# Patient Record
Sex: Female | Born: 1935 | Race: White | Hispanic: No | Smoking: Never smoker
Health system: Southern US, Community
[De-identification: ages and names within clinical notes are randomized; demographics above are authoritative.]

## PROBLEM LIST (undated history)

## (undated) DIAGNOSIS — E7800 Pure hypercholesterolemia, unspecified: Secondary | ICD-10-CM

## (undated) DIAGNOSIS — E079 Disorder of thyroid, unspecified: Secondary | ICD-10-CM

## (undated) DIAGNOSIS — I701 Atherosclerosis of renal artery: Secondary | ICD-10-CM

## (undated) DIAGNOSIS — R0989 Other specified symptoms and signs involving the circulatory and respiratory systems: Secondary | ICD-10-CM

## (undated) DIAGNOSIS — I10 Essential (primary) hypertension: Secondary | ICD-10-CM

## (undated) DIAGNOSIS — I2510 Atherosclerotic heart disease of native coronary artery without angina pectoris: Secondary | ICD-10-CM

## (undated) DIAGNOSIS — M1990 Unspecified osteoarthritis, unspecified site: Secondary | ICD-10-CM

## (undated) HISTORY — DX: Unspecified osteoarthritis, unspecified site: M19.90

## (undated) HISTORY — DX: Atherosclerosis of renal artery: I70.1

## (undated) HISTORY — DX: Essential (primary) hypertension: I10

## (undated) HISTORY — DX: Pure hypercholesterolemia, unspecified: E78.00

## (undated) HISTORY — PX: CATARACT EXTRACTION: SUR2

## (undated) HISTORY — PX: OTHER SURGICAL HISTORY: SHX169

## (undated) HISTORY — DX: Atherosclerotic heart disease of native coronary artery without angina pectoris: I25.10

## (undated) HISTORY — DX: Other specified symptoms and signs involving the circulatory and respiratory systems: R09.89

## (undated) HISTORY — DX: Disorder of thyroid, unspecified: E07.9

---

## 1976-05-25 HISTORY — PX: VARICOSE VEIN SURGERY: SHX832

## 1996-05-25 DIAGNOSIS — R0989 Other specified symptoms and signs involving the circulatory and respiratory systems: Secondary | ICD-10-CM

## 1996-05-25 HISTORY — DX: Other specified symptoms and signs involving the circulatory and respiratory systems: R09.89

## 2002-05-24 ENCOUNTER — Encounter: Payer: Self-pay | Admitting: Orthopedic Surgery

## 2002-05-25 HISTORY — PX: PARTIAL HIP ARTHROPLASTY: SHX733

## 2002-05-29 ENCOUNTER — Encounter: Payer: Self-pay | Admitting: Orthopedic Surgery

## 2002-05-29 ENCOUNTER — Inpatient Hospital Stay (HOSPITAL_COMMUNITY): Admission: RE | Admit: 2002-05-29 | Discharge: 2002-06-04 | Payer: Self-pay | Admitting: Orthopedic Surgery

## 2004-06-04 ENCOUNTER — Ambulatory Visit: Payer: Self-pay | Admitting: Internal Medicine

## 2005-03-30 ENCOUNTER — Ambulatory Visit: Payer: Self-pay | Admitting: Internal Medicine

## 2005-08-18 ENCOUNTER — Ambulatory Visit: Payer: Self-pay | Admitting: Internal Medicine

## 2005-08-31 ENCOUNTER — Ambulatory Visit: Payer: Self-pay | Admitting: Internal Medicine

## 2006-02-02 ENCOUNTER — Ambulatory Visit: Payer: Self-pay | Admitting: Cardiology

## 2006-02-02 ENCOUNTER — Ambulatory Visit: Payer: Self-pay | Admitting: Internal Medicine

## 2006-02-02 ENCOUNTER — Inpatient Hospital Stay (HOSPITAL_COMMUNITY): Admission: EM | Admit: 2006-02-02 | Discharge: 2006-02-03 | Payer: Self-pay | Admitting: Emergency Medicine

## 2006-02-05 ENCOUNTER — Ambulatory Visit: Payer: Self-pay | Admitting: Internal Medicine

## 2006-02-10 ENCOUNTER — Ambulatory Visit: Payer: Self-pay

## 2006-02-18 ENCOUNTER — Ambulatory Visit: Payer: Self-pay | Admitting: Internal Medicine

## 2006-04-07 ENCOUNTER — Ambulatory Visit: Payer: Self-pay | Admitting: Internal Medicine

## 2006-04-07 LAB — CONVERTED CEMR LAB
ALT: 14 units/L (ref 0–40)
AST: 26 units/L (ref 0–37)

## 2006-05-25 DIAGNOSIS — I2510 Atherosclerotic heart disease of native coronary artery without angina pectoris: Secondary | ICD-10-CM

## 2006-05-25 HISTORY — PX: OTHER SURGICAL HISTORY: SHX169

## 2006-05-25 HISTORY — DX: Atherosclerotic heart disease of native coronary artery without angina pectoris: I25.10

## 2006-07-05 ENCOUNTER — Ambulatory Visit: Payer: Self-pay | Admitting: Internal Medicine

## 2006-07-06 ENCOUNTER — Ambulatory Visit: Payer: Self-pay | Admitting: Internal Medicine

## 2006-07-06 LAB — CONVERTED CEMR LAB
Creatinine, Ser: 1.2 mg/dL (ref 0.4–1.2)
Potassium: 4.2 meq/L (ref 3.5–5.1)

## 2006-07-13 ENCOUNTER — Ambulatory Visit: Payer: Self-pay | Admitting: Internal Medicine

## 2006-08-29 ENCOUNTER — Observation Stay (HOSPITAL_COMMUNITY): Admission: EM | Admit: 2006-08-29 | Discharge: 2006-08-31 | Payer: Self-pay | Admitting: Emergency Medicine

## 2006-08-29 ENCOUNTER — Ambulatory Visit: Payer: Self-pay | Admitting: Cardiology

## 2006-08-30 ENCOUNTER — Ambulatory Visit: Payer: Self-pay | Admitting: Internal Medicine

## 2006-08-31 ENCOUNTER — Ambulatory Visit: Payer: Self-pay | Admitting: Vascular Surgery

## 2006-08-31 ENCOUNTER — Encounter: Payer: Self-pay | Admitting: Cardiology

## 2006-09-06 ENCOUNTER — Ambulatory Visit: Payer: Self-pay | Admitting: Internal Medicine

## 2006-09-06 LAB — CONVERTED CEMR LAB
Chloride: 109 meq/L (ref 96–112)
GFR calc Af Amer: 50 mL/min
GFR calc non Af Amer: 41 mL/min
Glucose, Bld: 81 mg/dL (ref 70–99)
Sodium: 145 meq/L (ref 135–145)

## 2006-09-15 ENCOUNTER — Ambulatory Visit: Payer: Self-pay | Admitting: Cardiovascular Disease

## 2006-11-08 ENCOUNTER — Telehealth (INDEPENDENT_AMBULATORY_CARE_PROVIDER_SITE_OTHER): Payer: Self-pay | Admitting: *Deleted

## 2006-11-15 ENCOUNTER — Telehealth (INDEPENDENT_AMBULATORY_CARE_PROVIDER_SITE_OTHER): Payer: Self-pay | Admitting: *Deleted

## 2006-11-15 ENCOUNTER — Ambulatory Visit: Payer: Self-pay | Admitting: Internal Medicine

## 2006-11-15 DIAGNOSIS — R609 Edema, unspecified: Secondary | ICD-10-CM | POA: Insufficient documentation

## 2006-11-15 DIAGNOSIS — I10 Essential (primary) hypertension: Secondary | ICD-10-CM

## 2006-11-15 DIAGNOSIS — M25579 Pain in unspecified ankle and joints of unspecified foot: Secondary | ICD-10-CM | POA: Insufficient documentation

## 2006-11-15 DIAGNOSIS — I8390 Asymptomatic varicose veins of unspecified lower extremity: Secondary | ICD-10-CM | POA: Insufficient documentation

## 2006-11-16 ENCOUNTER — Encounter: Payer: Self-pay | Admitting: Internal Medicine

## 2006-11-16 ENCOUNTER — Encounter (INDEPENDENT_AMBULATORY_CARE_PROVIDER_SITE_OTHER): Payer: Self-pay | Admitting: *Deleted

## 2006-11-16 LAB — CONVERTED CEMR LAB
BUN: 35 mg/dL — ABNORMAL HIGH
Creatinine, Ser: 1.7 mg/dL — ABNORMAL HIGH
Potassium: 4.6 meq/L
Uric Acid, Serum: 8.4 mg/dL — ABNORMAL HIGH

## 2006-12-01 ENCOUNTER — Ambulatory Visit: Payer: Self-pay | Admitting: Internal Medicine

## 2006-12-01 DIAGNOSIS — M109 Gout, unspecified: Secondary | ICD-10-CM

## 2006-12-03 ENCOUNTER — Encounter: Payer: Self-pay | Admitting: Internal Medicine

## 2006-12-03 ENCOUNTER — Encounter (INDEPENDENT_AMBULATORY_CARE_PROVIDER_SITE_OTHER): Payer: Self-pay | Admitting: *Deleted

## 2006-12-03 LAB — CONVERTED CEMR LAB
Creatinine, Ser: 1.6 mg/dL — ABNORMAL HIGH (ref 0.4–1.2)
Potassium: 4.3 meq/L (ref 3.5–5.1)
Uric Acid, Serum: 8.9 mg/dL — ABNORMAL HIGH (ref 2.4–7.0)

## 2006-12-07 ENCOUNTER — Telehealth: Payer: Self-pay | Admitting: Internal Medicine

## 2006-12-13 ENCOUNTER — Ambulatory Visit: Payer: Self-pay | Admitting: Cardiovascular Disease

## 2006-12-20 ENCOUNTER — Ambulatory Visit: Payer: Self-pay | Admitting: Internal Medicine

## 2006-12-20 DIAGNOSIS — L259 Unspecified contact dermatitis, unspecified cause: Secondary | ICD-10-CM | POA: Insufficient documentation

## 2006-12-22 LAB — CONVERTED CEMR LAB
Basophils Relative: 0.4 % (ref 0.0–1.0)
Hemoglobin: 11.6 g/dL — ABNORMAL LOW (ref 12.0–15.0)
Monocytes Absolute: 0.5 10*3/uL (ref 0.2–0.7)
Monocytes Relative: 6.3 % (ref 3.0–11.0)
RBC: 3.72 M/uL — ABNORMAL LOW (ref 3.87–5.11)
RDW: 12.2 % (ref 11.5–14.6)

## 2007-01-11 ENCOUNTER — Encounter (INDEPENDENT_AMBULATORY_CARE_PROVIDER_SITE_OTHER): Payer: Self-pay | Admitting: *Deleted

## 2007-01-11 ENCOUNTER — Telehealth: Payer: Self-pay | Admitting: Internal Medicine

## 2007-01-11 ENCOUNTER — Ambulatory Visit: Payer: Self-pay | Admitting: Internal Medicine

## 2007-01-11 DIAGNOSIS — R944 Abnormal results of kidney function studies: Secondary | ICD-10-CM

## 2007-03-25 ENCOUNTER — Encounter: Payer: Self-pay | Admitting: Internal Medicine

## 2007-04-28 ENCOUNTER — Telehealth (INDEPENDENT_AMBULATORY_CARE_PROVIDER_SITE_OTHER): Payer: Self-pay | Admitting: *Deleted

## 2007-06-30 ENCOUNTER — Telehealth (INDEPENDENT_AMBULATORY_CARE_PROVIDER_SITE_OTHER): Payer: Self-pay | Admitting: *Deleted

## 2007-09-20 ENCOUNTER — Ambulatory Visit: Payer: Self-pay | Admitting: Internal Medicine

## 2007-10-10 ENCOUNTER — Encounter (INDEPENDENT_AMBULATORY_CARE_PROVIDER_SITE_OTHER): Payer: Self-pay | Admitting: *Deleted

## 2007-10-21 ENCOUNTER — Ambulatory Visit: Payer: Self-pay | Admitting: Internal Medicine

## 2007-10-23 LAB — CONVERTED CEMR LAB: TSH: 2.52 microintl units/mL (ref 0.35–5.50)

## 2007-10-24 ENCOUNTER — Encounter (INDEPENDENT_AMBULATORY_CARE_PROVIDER_SITE_OTHER): Payer: Self-pay | Admitting: *Deleted

## 2007-10-25 ENCOUNTER — Ambulatory Visit: Payer: Self-pay | Admitting: Internal Medicine

## 2007-10-25 DIAGNOSIS — I739 Peripheral vascular disease, unspecified: Secondary | ICD-10-CM | POA: Insufficient documentation

## 2007-10-25 DIAGNOSIS — M899 Disorder of bone, unspecified: Secondary | ICD-10-CM | POA: Insufficient documentation

## 2007-10-25 DIAGNOSIS — M949 Disorder of cartilage, unspecified: Secondary | ICD-10-CM

## 2007-10-25 LAB — CONVERTED CEMR LAB: Vit D, 1,25-Dihydroxy: 37 (ref 30–89)

## 2007-10-27 ENCOUNTER — Telehealth (INDEPENDENT_AMBULATORY_CARE_PROVIDER_SITE_OTHER): Payer: Self-pay | Admitting: *Deleted

## 2007-11-01 ENCOUNTER — Encounter: Payer: Self-pay | Admitting: Internal Medicine

## 2007-11-01 ENCOUNTER — Ambulatory Visit: Payer: Self-pay

## 2007-11-28 ENCOUNTER — Encounter: Payer: Self-pay | Admitting: Internal Medicine

## 2008-02-23 ENCOUNTER — Telehealth (INDEPENDENT_AMBULATORY_CARE_PROVIDER_SITE_OTHER): Payer: Self-pay | Admitting: *Deleted

## 2008-03-01 ENCOUNTER — Ambulatory Visit: Payer: Self-pay | Admitting: Internal Medicine

## 2008-03-11 LAB — CONVERTED CEMR LAB
Albumin: 3.9 g/dL (ref 3.5–5.2)
Alkaline Phosphatase: 49 units/L (ref 39–117)
Bilirubin, Direct: 0.1 mg/dL (ref 0.0–0.3)
Calcium: 9.3 mg/dL (ref 8.4–10.5)
Cholesterol: 178 mg/dL (ref 0–200)
Eosinophils Absolute: 0.3 10*3/uL (ref 0.0–0.7)
GFR calc Af Amer: 42 mL/min
GFR calc non Af Amer: 35 mL/min
HCT: 37.7 % (ref 36.0–46.0)
HDL: 52.3 mg/dL (ref >39.0)
Hemoglobin: 12.5 g/dL (ref 12.0–15.0)
MCHC: 33.3 g/dL (ref 30.0–36.0)
MCV: 90.9 fL (ref 78.0–100.0)
Monocytes Absolute: 0.2 10*3/uL (ref 0.1–1.0)
Neutro Abs: 4.3 10*3/uL (ref 1.4–7.7)
Platelets: 170 10*3/uL (ref 150–400)
Potassium: 4.5 meq/L (ref 3.5–5.1)
RDW: 13 % (ref 11.5–14.6)
Sodium: 143 meq/L (ref 135–145)
Total CHOL/HDL Ratio: 3.4
Total Protein: 6.4 g/dL (ref 6.0–8.3)
Triglycerides: 66 mg/dL (ref 0–149)

## 2008-03-12 ENCOUNTER — Encounter (INDEPENDENT_AMBULATORY_CARE_PROVIDER_SITE_OTHER): Payer: Self-pay | Admitting: *Deleted

## 2008-05-28 ENCOUNTER — Encounter: Payer: Self-pay | Admitting: Internal Medicine

## 2008-05-28 ENCOUNTER — Telehealth (INDEPENDENT_AMBULATORY_CARE_PROVIDER_SITE_OTHER): Payer: Self-pay | Admitting: *Deleted

## 2008-06-26 ENCOUNTER — Telehealth (INDEPENDENT_AMBULATORY_CARE_PROVIDER_SITE_OTHER): Payer: Self-pay | Admitting: *Deleted

## 2008-07-19 ENCOUNTER — Ambulatory Visit: Payer: Self-pay | Admitting: Internal Medicine

## 2008-07-20 ENCOUNTER — Telehealth (INDEPENDENT_AMBULATORY_CARE_PROVIDER_SITE_OTHER): Payer: Self-pay | Admitting: *Deleted

## 2008-09-19 ENCOUNTER — Ambulatory Visit: Payer: Self-pay | Admitting: Internal Medicine

## 2008-09-25 ENCOUNTER — Telehealth: Payer: Self-pay | Admitting: Cardiovascular Disease

## 2008-09-25 ENCOUNTER — Telehealth (INDEPENDENT_AMBULATORY_CARE_PROVIDER_SITE_OTHER): Payer: Self-pay | Admitting: *Deleted

## 2008-12-14 ENCOUNTER — Telehealth: Payer: Self-pay | Admitting: Cardiovascular Disease

## 2009-03-11 ENCOUNTER — Encounter: Payer: Self-pay | Admitting: Internal Medicine

## 2009-03-11 ENCOUNTER — Encounter: Payer: Self-pay | Admitting: Cardiovascular Disease

## 2009-04-03 ENCOUNTER — Encounter (INDEPENDENT_AMBULATORY_CARE_PROVIDER_SITE_OTHER): Payer: Self-pay | Admitting: *Deleted

## 2009-04-03 ENCOUNTER — Telehealth (INDEPENDENT_AMBULATORY_CARE_PROVIDER_SITE_OTHER): Payer: Self-pay | Admitting: *Deleted

## 2009-04-08 ENCOUNTER — Encounter: Payer: Self-pay | Admitting: Internal Medicine

## 2009-04-12 ENCOUNTER — Ambulatory Visit: Payer: Self-pay | Admitting: Internal Medicine

## 2009-04-16 ENCOUNTER — Encounter (INDEPENDENT_AMBULATORY_CARE_PROVIDER_SITE_OTHER): Payer: Self-pay | Admitting: *Deleted

## 2009-04-17 ENCOUNTER — Ambulatory Visit: Payer: Self-pay | Admitting: Internal Medicine

## 2009-04-17 DIAGNOSIS — E039 Hypothyroidism, unspecified: Secondary | ICD-10-CM | POA: Insufficient documentation

## 2009-04-17 DIAGNOSIS — E785 Hyperlipidemia, unspecified: Secondary | ICD-10-CM

## 2009-04-17 DIAGNOSIS — R21 Rash and other nonspecific skin eruption: Secondary | ICD-10-CM | POA: Insufficient documentation

## 2009-04-17 LAB — CONVERTED CEMR LAB
Cholesterol, target level: 200 mg/dL
HDL goal, serum: 40 mg/dL
LDL Goal: 100 mg/dL

## 2009-04-23 LAB — CONVERTED CEMR LAB
Cholesterol: 147 mg/dL (ref 0–200)
Uric Acid, Serum: 9.8 mg/dL — ABNORMAL HIGH (ref 2.4–7.0)

## 2009-04-25 DIAGNOSIS — K219 Gastro-esophageal reflux disease without esophagitis: Secondary | ICD-10-CM

## 2009-04-25 DIAGNOSIS — M1990 Unspecified osteoarthritis, unspecified site: Secondary | ICD-10-CM

## 2009-04-25 DIAGNOSIS — I701 Atherosclerosis of renal artery: Secondary | ICD-10-CM | POA: Insufficient documentation

## 2009-04-25 DIAGNOSIS — I2510 Atherosclerotic heart disease of native coronary artery without angina pectoris: Secondary | ICD-10-CM | POA: Insufficient documentation

## 2009-04-29 ENCOUNTER — Ambulatory Visit: Payer: Self-pay | Admitting: Cardiovascular Disease

## 2009-06-12 ENCOUNTER — Telehealth (INDEPENDENT_AMBULATORY_CARE_PROVIDER_SITE_OTHER): Payer: Self-pay | Admitting: *Deleted

## 2009-07-05 ENCOUNTER — Telehealth (INDEPENDENT_AMBULATORY_CARE_PROVIDER_SITE_OTHER): Payer: Self-pay | Admitting: *Deleted

## 2009-09-16 ENCOUNTER — Telehealth (INDEPENDENT_AMBULATORY_CARE_PROVIDER_SITE_OTHER): Payer: Self-pay | Admitting: *Deleted

## 2009-09-23 ENCOUNTER — Telehealth (INDEPENDENT_AMBULATORY_CARE_PROVIDER_SITE_OTHER): Payer: Self-pay | Admitting: *Deleted

## 2009-10-08 ENCOUNTER — Ambulatory Visit: Payer: Self-pay | Admitting: Internal Medicine

## 2009-10-08 ENCOUNTER — Encounter: Payer: Self-pay | Admitting: Internal Medicine

## 2009-10-10 ENCOUNTER — Telehealth (INDEPENDENT_AMBULATORY_CARE_PROVIDER_SITE_OTHER): Payer: Self-pay | Admitting: *Deleted

## 2010-01-21 ENCOUNTER — Telehealth: Payer: Self-pay | Admitting: Internal Medicine

## 2010-01-22 ENCOUNTER — Ambulatory Visit: Payer: Self-pay | Admitting: Internal Medicine

## 2010-01-22 DIAGNOSIS — M2550 Pain in unspecified joint: Secondary | ICD-10-CM

## 2010-01-22 DIAGNOSIS — I959 Hypotension, unspecified: Secondary | ICD-10-CM

## 2010-01-22 DIAGNOSIS — R5381 Other malaise: Secondary | ICD-10-CM

## 2010-01-22 DIAGNOSIS — R5383 Other fatigue: Secondary | ICD-10-CM

## 2010-01-28 LAB — CONVERTED CEMR LAB
Basophils Relative: 0.4 % (ref 0.0–3.0)
CO2: 25 meq/L (ref 19–32)
Chloride: 109 meq/L (ref 96–112)
Eosinophils Absolute: 0.2 10*3/uL (ref 0.0–0.7)
Eosinophils Relative: 2.3 % (ref 0.0–5.0)
Hemoglobin: 11.9 g/dL — ABNORMAL LOW (ref 12.0–15.0)
MCHC: 34 g/dL (ref 30.0–36.0)
MCV: 90.8 fL (ref 78.0–100.0)
Monocytes Absolute: 0.5 10*3/uL (ref 0.1–1.0)
Neutro Abs: 4.2 10*3/uL (ref 1.4–7.7)
Neutrophils Relative %: 54.1 % (ref 43.0–77.0)
Potassium: 4.7 meq/L (ref 3.5–5.1)
RBC: 3.85 M/uL — ABNORMAL LOW (ref 3.87–5.11)
Rhuematoid fact SerPl-aCnc: 22 intl units/mL — ABNORMAL HIGH (ref 0–20)
Sed Rate: 27 mm/hr — ABNORMAL HIGH (ref 0–22)
Sodium: 146 meq/L — ABNORMAL HIGH (ref 135–145)
WBC: 7.7 10*3/uL (ref 4.5–10.5)

## 2010-03-17 ENCOUNTER — Encounter: Payer: Self-pay | Admitting: Cardiovascular Disease

## 2010-03-17 ENCOUNTER — Encounter: Payer: Self-pay | Admitting: Internal Medicine

## 2010-03-24 ENCOUNTER — Encounter: Payer: Self-pay | Admitting: Internal Medicine

## 2010-03-24 ENCOUNTER — Encounter: Payer: Self-pay | Admitting: Cardiovascular Disease

## 2010-05-01 ENCOUNTER — Ambulatory Visit: Payer: Self-pay | Admitting: Cardiovascular Disease

## 2010-05-01 ENCOUNTER — Encounter: Payer: Self-pay | Admitting: Cardiovascular Disease

## 2010-06-26 NOTE — Assessment & Plan Note (Signed)
Summary: f1y   Visit Type:  1 year follow up Primary Clearence Vitug:  Marga Melnick MD  CC:  No cardiac complaints.  History of Present Illness: This is a delightful 77 year old woman presenting today for followup evaluation of her renovascular hypertension and lower extremity peripheral arterial disease.   She had ABI's in 2009 of 0.6 on the right and 0.75 on the left...these were stable from previous years. She also underwent catheterization in 2008 at which time she was diagnosed with unilateral renal artery stenosis with a 70-80% right renal stenosis.  Her BP control has been reasonably good over the past few years and renal function has remained stable. I reviewed labwork from [facility] showing most recent creatinine of 1.4 mg/dL with previous values as high as 1.9 mg/dL.  She remains as active as possible. She recently carted 18 bags of leaves from her yard to the street for pickup! She denies chest pain. She complains of fatigue and rests frequently in between activities.  Current Medications (verified): 1)  Levothyroxine Sodium 50 Mcg Tabs (Levothyroxine Sodium) .... Take 1 Tablet By Mouth Once A Day and Extra 1/2 Tablet On Thursdays and Saturdays 2)  Simvastatin 20 Mg Tabs (Simvastatin) .Marland Kitchen.. 1 By Mouth Qhs 3)  Norvasc 10 Mg  Tabs (Amlodipine Besylate) .... Take 1/2 Once Daily 4)  Furosemide 40 Mg  Tabs (Furosemide) .Marland Kitchen.. 1 Once Daily 5)  Fosamax 70 Mg  Tabs (Alendronate Sodium) .Marland Kitchen.. 1 By Mouth Wkly 6)  Multivitamins  Tabs (Multiple Vitamin) .... Once A Day 7)  Caltrate 600+d Plus 600-400 Mg-Unit Tabs (Calcium Carbonate-Vit D-Min) .... Take 1 Tablet By Mouth Two Times A Day 8)  Cozaar 100 Mg Tabs (Losartan Potassium) .... Take 1 Tablet By Mouth Once A Day 9)  Tylenol Extra Strength 500 Mg Tabs (Acetaminophen) .Marland Kitchen.. 1-2 By Mouth Once Daily  Allergies (verified): No Known Drug Allergies  Past History:  Past medical history reviewed for relevance to current acute and chronic  problems.  Past Medical History: Reviewed history from 04/25/2009 and no changes required. Current Problems:  CAD (ICD-414.00)-Nonobstructive HYPERTENSION (ICD-401.9) CLAUDICATION (ICD-443.9) RENAL ARTERY STENOSIS (ICD-440.1) GOUT (ICD-274.9) GERD (ICD-530.81) HYPERLIPIDEMIA (ICD-272.4) RASH-NONVESICULAR (ICD-782.1) HYPOTHYROIDISM (ICD-244.9) OSTEOPENIA (ICD-733.90) ABNORMAL RESULT, FUNCTION STUDY, KIDNEY (ICD-794.4) DERMATITIS (ICD-692.9) ANKLE EDEMA (ICD-782.3) PAIN IN JOINT, ANKLE/FOOT (ICD-719.47) VARICOSE VEIN, LWR EXTREMITIES, ASYMPTOMATIC (ICD-454.9) DEGENERATIVE JOINT DISEASE (ICD-715.90)  Review of Systems       Negative except as per HPI   Vital Signs:  Patient profile:   77 year old female Height:      63.5 inches Weight:      148.75 pounds BMI:     26.03 Pulse rate:   54 / minute Pulse rhythm:   regular Resp:     18 per minute BP sitting:   128 / 60  (left arm) Cuff size:   regular  Vitals Entered By: Vikki Ports (May 01, 2010 12:42 PM)  Physical Exam  General:  Pt is alert and oriented, elderly woman in no acute distress. HEENT: normal Neck: normal carotid upstrokes with bilateral soft bruits, JVP normal Lungs: CTA CV: RRR without murmur or gallop Abd: soft, NT, positive BS, no bruit, no organomegaly Ext: no clubbing, cyanosis, or edema. pedal pulses nonpalpable but feet warm. Skin: warm and dry without rash    Impression & Recommendations:  Problem # 1:  CAD (ICD-414.00) Stable without angina. Continue low-dose ASA.  Her updated medication list for this problem includes:    Norvasc 10 Mg Tabs (Amlodipine  besylate) .Marland Kitchen... Take 1/2 once daily  Orders: EKG w/ Interpretation (93000)  Problem # 2:  CLAUDICATION (ICD-443.9) Stable, mildly lifestyle-liimiting claudication. ABI's reviewed. Conservative therapy appropriate.  Problem # 3:  RENAL ARTERY STENOSIS (ICD-440.1) Good BP control and stable renal function. Continue to observe.    Patient Instructions: 1)  Your physician recommends that you continue on your current medications as directed. Please refer to the Current Medication list given to you today. 2)  Your physician wants you to follow-up in:  1 YEAR.  You will receive a reminder letter in the mail two months in advance. If you don't receive a letter, please call our office to schedule the follow-up appointment.

## 2010-06-26 NOTE — Progress Notes (Signed)
Summary: Refill Request  Phone Note Refill Request Call back at [phone] Message from:  Pharmacy on September 16, 2009 12:57 PM  Refills Requested: Medication #1:  FOSAMAX 70 MG  TABS 1 by mouth wkly   Dosage confirmed as above?Dosage Confirmed   Brand Name Necessary? No   Supply Requested: 3 months   Notes: 3 refills Next Appointment Scheduled: none Initial call taken by: Harold Barban,  September 16, 2009 12:57 PM    Prescriptions: FOSAMAX 70 MG  TABS (ALENDRONATE SODIUM) 1 by mouth wkly  #12 x 0   Entered by:   Shonna Chock   Authorized by:   Marga Melnick MD   Signed by:   Shonna Chock on 09/16/2009   Method used:   Printed then faxed to ...       [facility] [address] (retail)       [phone]       [place], [place]  [address]       Ph: [phone]       Fax: [phone]   RxID:   [id number]   Appended Document: Refill Request RX was faxed [phone]

## 2010-06-26 NOTE — Letter (Signed)
Summary:  [facility] Patient Note   [facility] Visit Note   Imported By: Roderic Ovens 05/01/2010 10:06:34  _____________________________________________________________________  External Attachment:    Type:   Image     Comment:   External Document

## 2010-06-26 NOTE — Assessment & Plan Note (Signed)
Summary: low BP, swelling, joint pain in hands//fd   Vital Signs:  Patient profile:   77 year old female Weight:      151 pounds BMI:     26.42 O2 Sat:      97 % Temp:     98.6 degrees F oral Pulse rate:   60 / minute Resp:     19 per minute BP sitting:   118 / 60  (left arm) Cuff size:   regular  Vitals Entered By: Shonna Chock CMA (January 22, 2010 2:13 PM) CC: 1.) B/P was 80/42 yesterday  2.) Patient with pain all over which causes her not to sleep at night, Lower Extremity Joint pain   Primary Care Provider:  Marga Melnick MD  CC:  1.) B/P was 80/42 yesterday  2.) Patient with pain all over which causes her not to sleep at night and Lower Extremity Joint pain.  History of Present Illness:      This is a 77 year old woman who presents with Upper Extremity Joint pains X 3 weeks.  The patient reports swelling, redness, stiffness for >1 hr, and decreased ROM, but denies locking and popping.  The pain is located in the right and left hands, arms & shoulders.  The pain began gradually.  The pain is described as aching and constant. No evaluation to date ; no PMH of PMR.  Since  laser surgery she has had  photosensitivity.  The patient denies the following symptoms: fever, rash, eye symptoms (other than tearing in am from  OD), diarrhea, and dysuria.        The patient also presents for Hypotension  for 2-3 days with BP   as low as  80/ 60 on 08/29. The patient reports lightheadedness and fatigue, but denies urinary frequency, headaches, and rash.  Associated symptoms include exercise intolerance  only due to the diffuse arthralgias.  The patient denies the following associated symptoms: chest pain, chest pressure, dyspnea, palpitations, syncope, leg edema, and pedal edema.  BP up with decrease in Norvasc to 10 mg 1/2 once daily as of today .  Current Medications (verified): 1)  Levothyroxine Sodium 50 Mcg Tabs (Levothyroxine Sodium) .Marland Kitchen.. 1 By Mouth Daily Except 1and1/2 Tabs On  Tues,thurs,sat 2)  Simvastatin 20 Mg Tabs (Simvastatin) .Marland Kitchen.. 1 By Mouth Qhs 3)  Norvasc 10 Mg  Tabs (Amlodipine Besylate) .... Take 1/2 Once Daily 4)  Furosemide 40 Mg  Tabs (Furosemide) .Marland Kitchen.. 1 Once Daily 5)  Fosamax 70 Mg  Tabs (Alendronate Sodium) .Marland Kitchen.. 1 By Mouth Wkly 6)  Multivitamins  Tabs (Multiple Vitamin) .... Once A Day 7)  Caltrate 600+d Plus 600-400 Mg-Unit Tabs (Calcium Carbonate-Vit D-Min) .... Take 1 Tablet By Mouth Two Times A Day 8)  Cozaar 100 Mg Tabs (Losartan Potassium) .... Take 1 Tablet By Mouth Once A Day 9)  Tylenol Extra Strength 500 Mg Tabs (Acetaminophen) .Marland Kitchen.. 1-2 By Mouth Once Daily  Allergies (verified): No Known Drug Allergies  Review of Systems General:  Denies chills, sweats, and weight loss. Eyes:  Denies discharge, double vision, and eye pain. GI:  Denies abdominal pain, bloody stools, and dark tarry stools. GU:  Denies discharge and hematuria.  Physical Exam  General:  Appears younger than age,in no acute distress; alert,appropriate and cooperative throughout examination Neck:  Torticollis to L; thyroid normal Lungs:  Normal respiratory effort, chest expands symmetrically. Lungs are clear to auscultation, no crackles or wheezes. Heart:  regular rhythm, no murmur, no gallop, no rub,  no JVD, no HJR, and bradycardia.   Abdomen:  Bowel sounds positive,abdomen soft and non-tender without masses, organomegaly or hernias noted. Pulses:  R and L carotid,radial,dorsalis pedis and posterior tibial pulses are full and equal bilaterally Extremities:  No clubbing, cyanosis, edema. Mixed arthritic hand changes Neurologic:  alert & oriented X3 and DTRs symmetrical and normal.   Skin:  Intact without suspicious lesions or rashes Cervical Nodes:  No lymphadenopathy noted Axillary Nodes:  No palpable lymphadenopathy Psych:  memory intact for recent and remote, normally interactive, and good eye contact.     Impression & Recommendations:  Problem # 1:  ARTHRALGIA  (ICD-719.40)  Diffuse; R/O PMR  Orders: Venipuncture (78295) TLB-Sedimentation Rate (ESR) (85652-ESR) Prescription Created Electronically [phone]) Specimen Handling (86578)  Problem # 2:  HYPOTENSION (ICD-458.9)  Orders: Venipuncture (46962) TLB-BMP (Basic Metabolic Panel-BMET) (80048-METABOL) Specimen Handling (95284)  Problem # 3:  FATIGUE (ICD-780.79)  Orders: Venipuncture (13244) TLB-CBC Platelet - w/Differential (85025-CBCD) TLB-TSH (Thyroid Stimulating Hormone) (84443-TSH) Specimen Handling (01027)  Complete Medication List: 1)  Levothyroxine Sodium 50 Mcg Tabs (Levothyroxine sodium) .Marland Kitchen.. 1 by mouth daily except 1and1/2 tabs on tues,thurs,sat 2)  Simvastatin 20 Mg Tabs (Simvastatin) .Marland Kitchen.. 1 by mouth qhs 3)  Norvasc 10 Mg Tabs (Amlodipine besylate) .... Take 1/2 once daily 4)  Furosemide 40 Mg Tabs (Furosemide) .Marland Kitchen.. 1 once daily 5)  Fosamax 70 Mg Tabs (Alendronate sodium) .Marland Kitchen.. 1 by mouth wkly 6)  Multivitamins Tabs (Multiple vitamin) .... Once a day 7)  Caltrate 600+d Plus 600-400 Mg-unit Tabs (Calcium carbonate-vit d-min) .... Take 1 tablet by mouth two times a day 8)  Cozaar 100 Mg Tabs (Losartan potassium) .... Take 1 tablet by mouth once a day 9)  Tylenol Extra Strength 500 Mg Tabs (Acetaminophen) .Marland Kitchen.. 1-2 by mouth once daily 10)  Tramadol Hcl 50 Mg Tabs (Tramadol hcl) .... 1/2 -1  every 6 hrs as needed for pain  Patient Instructions: 1)  Use Tramadol as Rxed until labs return Prescriptions: TRAMADOL HCL 50 MG TABS (TRAMADOL HCL) 1/2 -1  every 6 hrs as needed for pain  #30 x 2   Entered and Authorized by:   Marga Melnick MD   Signed by:   Shonna Chock CMA on 01/22/2010   Method used:   Faxed to ...       [facility] [address] (retail)       [phone]       [place], [place]  [address]       Ph: [phone]       Fax: [phone]   RxID:   [id number]

## 2010-06-26 NOTE — Letter (Signed)
Summary: Results Follow up Letter  [facility]  [address] [place], [place] [address]   Phone: [phone]  Fax: [phone]    01/11/2007 MRN: [id number]  [facility] [address] [place], [place]  [address]  Dear Ms. Kush,  The following are the results of your recent test(s):  Test         Result    Pap Smear:        Normal _____  Not Normal _____ Comments: ______________________________________________________ Cholesterol: LDL(Bad cholesterol):         Your goal is less than:         HDL (Good cholesterol):       Your goal is more than: Comments:  ______________________________________________________ Mammogram:        Normal _____  Not Normal _____ Comments:  ___________________________________________________________________ Hemoccult:        Normal _X____  Not normal _______ Comments:    _____________________________________________________________________ Other Tests:    We routinely do not discuss normal results over the telephone.  If you desire a copy of the results, or you have any questions about this information we can discuss them at your next office visit.   Sincerely,

## 2010-06-26 NOTE — Progress Notes (Signed)
Summary: low BP, joint pain. swelling  Phone Note Call from Patient   Summary of Call: pt c/o  pain, swelling,  numbness in both hands mainly in fingers at night. pt also states that on yesterday BP 80/42. pt c/o some dizziness when BP was low. pt denies any Chest pain, SOB, or headaches. Pt states that BP did increase throughout the day and symptoms resolved as well too.Pt BP at bedtime was 128/51 and today BP 117/54 and now BP 120/62. pt has been monitoring BP since episode on yesterday. pt has pending OV tomorrow with you and advise if BP drops low again and symptoms return she needs to be seen in ED/UC prior to appt, pt ok and verbalized understanding............Marland KitchenFelecia Deloach CMA  January 21, 2010 3:49 PM   Follow-up for Phone Call        decrease Amlodipine to 10 mg 1/2 once daily if not already taken  ; bring all meds to appt Follow-up by: Marga Melnick MD,  January 21, 2010 4:06 PM  Additional Follow-up for Phone Call Additional follow up Details #1::        pt aware, verbalized understanding..............Marland KitchenFelecia Deloach CMA  January 21, 2010 4:25 PM     New/Updated Medications: NORVASC 10 MG  TABS (AMLODIPINE BESYLATE) Take 1/2 once daily

## 2010-06-26 NOTE — Progress Notes (Signed)
Summary: refill  Phone Note Refill Request Message from:  Fax from Pharmacy on [facility] fax [phone]  Refills Requested: Medication #1:  SIMVASTATIN 20 MG TABS 1 by mouth qhs Initial call taken by: Barb Merino,  July 05, 2009 9:48 AM    Prescriptions: SIMVASTATIN 20 MG TABS (SIMVASTATIN) 1 by mouth qhs  #90 x 3   Entered by:   Shonna Chock   Authorized by:   Marga Melnick MD   Signed by:   Shonna Chock on 07/05/2009   Method used:   Faxed to ...       [facility] (mail-order)             ,          Ph: [phone]       Fax: [phone]   RxID:   [id number]

## 2010-06-26 NOTE — Miscellaneous (Signed)
Summary: BONE DENSITY  Clinical Lists Changes  Orders: Added new Test order of T-Bone Densitometry (77080) - Signed Added new Test order of T-Lumbar Vertebral Assessment (77082) - Signed 

## 2010-06-26 NOTE — Progress Notes (Signed)
Summary: Refill Request  Phone Note Refill Request Call back at [phone] Message from:  Pharmacy on Sep 23, 2009 11:10 AM  Refills Requested: Medication #1:  FUROSEMIDE 40 MG  TABS 1 once daily   Dosage confirmed as above?Dosage Confirmed   Supply Requested: 1 month [facility]  Next Appointment Scheduled: none Initial call taken by: Harold Barban,  Sep 23, 2009 11:10 AM    Prescriptions: FUROSEMIDE 40 MG  TABS (FUROSEMIDE) 1 once daily  #90 x 2   Entered by:   Shonna Chock   Authorized by:   Marga Melnick MD   Signed by:   Shonna Chock on 09/23/2009   Method used:   Faxed to ...       [facility]* (mail-order)             ,          Ph: [phone]       Fax: [phone]   RxID:   [id number]

## 2010-06-26 NOTE — Letter (Signed)
Summary: [facility]  [facility]   Imported By: Lanelle Bal 04/08/2010 09:48:55  _____________________________________________________________________  External Attachment:    Type:   Image     Comment:   External Document

## 2010-06-26 NOTE — Progress Notes (Signed)
Summary: Appointment Due  Phone Note Outgoing Call Call back at [facility] Phone [phone]   Call placed by: Shonna Chock,  September 16, 2009 1:22 PM Call placed to: Patient Summary of Call: Please contact patient and inform her she is due for a BMD, she can call to set up [phone]) or we can set up for her.  Chrae Malloy  September 16, 2009 1:27 PM   Follow-up for Phone Call        Patient was given phone number and will call and make her own appt.  Follow-up by: Harold Barban,  Sep 24, 2009 10:30 AM

## 2010-06-26 NOTE — Progress Notes (Signed)
Summary: Refill Request  Phone Note Refill Request Call back at [phone] Message from:  Pharmacy on Oct 10, 2009 9:32 AM  Refills Requested: Medication #1:  COZAAR 100 MG TABS Take 1 tablet by mouth once a day.   Dosage confirmed as above?Dosage Confirmed   Brand Name Necessary? No   Supply Requested: 3 months [facility]  Next Appointment Scheduled: none Initial call taken by: Harold Barban,  Oct 10, 2009 9:32 AM    Prescriptions: COZAAR 100 MG TABS (LOSARTAN POTASSIUM) Take 1 tablet by mouth once a day  #90 x 1   Entered by:   Shonna Chock   Authorized by:   Marga Melnick MD   Signed by:   Shonna Chock on 10/10/2009   Method used:   Faxed to ...       [facility] (mail-order)             ,          Ph: [phone]       Fax: [phone]   RxID:   [id number]

## 2010-06-26 NOTE — Progress Notes (Signed)
Summary: Request for Handicapp Sticker  Phone Note Call from Patient Call back at Home Phone [phone]   Caller: Patient Summary of Call: Message left on VM: Patient would like a handicapp sticker   I called patient and informed her Handicapp paper  is avaliable, patient requested that I mail it to her. Mailed Initial call taken by: Shonna Chock,  June 12, 2009 1:29 PM

## 2010-08-04 ENCOUNTER — Telehealth (INDEPENDENT_AMBULATORY_CARE_PROVIDER_SITE_OTHER): Payer: Self-pay | Admitting: *Deleted

## 2010-08-05 ENCOUNTER — Telehealth (INDEPENDENT_AMBULATORY_CARE_PROVIDER_SITE_OTHER): Payer: Self-pay | Admitting: *Deleted

## 2010-08-06 ENCOUNTER — Other Ambulatory Visit: Payer: Medicare Other

## 2010-08-08 ENCOUNTER — Other Ambulatory Visit: Payer: Self-pay | Admitting: Internal Medicine

## 2010-08-08 ENCOUNTER — Other Ambulatory Visit (INDEPENDENT_AMBULATORY_CARE_PROVIDER_SITE_OTHER): Payer: Medicare Other

## 2010-08-08 ENCOUNTER — Encounter (INDEPENDENT_AMBULATORY_CARE_PROVIDER_SITE_OTHER): Payer: Self-pay | Admitting: *Deleted

## 2010-08-08 DIAGNOSIS — E785 Hyperlipidemia, unspecified: Secondary | ICD-10-CM

## 2010-08-08 DIAGNOSIS — T887XXA Unspecified adverse effect of drug or medicament, initial encounter: Secondary | ICD-10-CM

## 2010-08-08 LAB — HEPATIC FUNCTION PANEL
AST: 25 U/L (ref 0–37)
Albumin: 3.8 g/dL (ref 3.5–5.2)
Alkaline Phosphatase: 51 U/L (ref 39–117)
Total Protein: 5.9 g/dL — ABNORMAL LOW (ref 6.0–8.3)

## 2010-08-08 LAB — LIPID PANEL: HDL: 48.9 mg/dL (ref >39.00)

## 2010-08-12 NOTE — Progress Notes (Signed)
Summary: REfill  Phone Note Refill Request Message from:  Patient on August 05, 2010 12:41 PM  Refills Requested: Medication #1:  SIMVASTATIN 20 MG TABS 1 by mouth at bedtime**LABS DUE NOW**   Dosage confirmed as above?Dosage Confirmed   Supply Requested: 1 month pt is requesting 14 day supply until she can get her rx from the mail order. pt has appt for the labs that are due tomorrow morning  Next Appointment Scheduled: none Initial call taken by: Lavell Islam,  August 05, 2010 12:42 PM    Prescriptions: SIMVASTATIN 20 MG TABS (SIMVASTATIN) 1 by mouth at bedtime**LABS DUE NOW**  #15 x 0   Entered by:   Shonna Chock CMA   Authorized by:   Marga Melnick MD   Signed by:   Shonna Chock CMA on 08/05/2010   Method used:   Electronically to        [facility]* (retail)       [phone]       [place], [place]  [address]       Ph: [phone]       Fax: [phone]   RxID:   [id number]

## 2010-08-12 NOTE — Progress Notes (Signed)
Summary: LOSARTAN REFILL  Phone Note Refill Request Call back at Home Phone [phone] Message from:  Patient on August 04, 2010 12:17 PM  Refills Requested: Medication #1:  COZAAR 100 MG TABS Take 1 tablet by mouth once a day [facility]  Initial call taken by: Jerolyn Shin,  August 04, 2010 12:18 PM    Prescriptions: COZAAR 100 MG TABS (LOSARTAN POTASSIUM) Take 1 tablet by mouth once a day  #90 Tablet x 1   Entered by:   Shonna Chock CMA   Authorized by:   Marga Melnick MD   Signed by:   Shonna Chock CMA on 08/04/2010   Method used:   Faxed to ...       [facility] (mail-order)             , [place]         Ph: [phone]       Fax: [phone]   RxID:   [id number]

## 2010-08-19 ENCOUNTER — Encounter: Payer: Self-pay | Admitting: Internal Medicine

## 2010-08-19 ENCOUNTER — Ambulatory Visit (INDEPENDENT_AMBULATORY_CARE_PROVIDER_SITE_OTHER): Payer: Medicare Other | Admitting: Internal Medicine

## 2010-08-19 DIAGNOSIS — I2510 Atherosclerotic heart disease of native coronary artery without angina pectoris: Secondary | ICD-10-CM

## 2010-08-19 DIAGNOSIS — E785 Hyperlipidemia, unspecified: Secondary | ICD-10-CM

## 2010-08-19 MED ORDER — ROSUVASTATIN CALCIUM 20 MG PO TABS: 20.0000 mg | ORAL_TABLET | Freq: Every day | ORAL | Status: DC

## 2010-08-19 NOTE — Patient Instructions (Signed)
Please change to simvastatin 20 mg at bedtime to  Crestor 20 mg daily. In 10 weeks please check fasting labs (lipids, CK, and hepatic panel ; codes 272.4 , 995.20 ).

## 2010-08-19 NOTE — Progress Notes (Signed)
  Subjective:    [name], female    DOB: [birth year], 77 y.o.   MRN: [id number]  HPI    Labs were reviewed and risks discussed. Because of a history of coronary disease her LDL or bad cholesterol should be less than 100 ; it is 122.7 on simvastatin 20 mg daily. She is also on amlodipine 10 mg daily ; because of this the simvastatin cannot be raised. She does have drug coverage through her health care plan; Crestor would prevent the risk other statins might have.    Review of Systems  She has seen  Dr. Excell Seltzer her  cardiologist who has released her for 12 months. She denies chest pain, palpitations, paroxysmal nocturnal dyspnea, or edema. She does describe some calf discomfort at night which responds to quinine. This occurs 2 times a week on average. The Symptoms occur mainly after she works outside during the day. She does not have frank claudication symptoms.       Objective:   Physical Exam   On exam she is in no distress; she appears younger than her stated age. She is oriented x3.   chest clear to auscultation without rales.  She is a slow rhythm with an S4 and slight slurring.   the pedal pulses are decreased; she has no edema or cyanosis. Osteoarthritic changes are present hands.          Assessment & Plan:   #1 dyslipidemia with suboptimal LDL level ; simvastatin should not be increased because of concomitant amlodipine therapy.    Plan: #1 change simvastatin 22 Crestor 20 mg daily. Recheck fasting labs in 10 weeks (lipids , CK, hepatic panel,; 272.4, 995.20).

## 2010-08-28 ENCOUNTER — Other Ambulatory Visit: Payer: Self-pay | Admitting: Cardiovascular Disease

## 2010-10-07 NOTE — Assessment & Plan Note (Signed)
[facility]                            CARDIOLOGY OFFICE NOTE   Heather, Morrison                       MRN:          [id number]  DATE:12/13/2006                            DOB:          06/25/1916    Heather Morrison was seen in followup as an outpatient at the [facility]  Cardiology Office on December 13, 2006.  She is a delightful 77 year old  woman who was hospitalized in April with an acute coronary syndrome and  hypertensive urgency.  She was found to have moderate coronary artery  disease and medical therapy was initiated.  She also underwent an aortic  angiogram that was suggestive of high grade right renal artery stenosis,  a 70% lesion was seen with selective renal angiography.  She also  underwent ABIs that showed an ABI of 0.6 on the right and 0.72 on the  left.   From a symptomatic standpoint Heather Morrison has been doing very well.  Unfortunately she developed gout involving the right ankle but her  symptoms have nearly resolved under the treatment of Dr. Alwyn Ren.  She is  currently taking allopurinol for prophylaxis.  She brings in blood  pressure readings and her blood pressure control has been outstanding.  Blood pressures range from 89 to 124 systolic over 47 to 61 diastolic,  with heart rates in the range of 55 to 61.  Symptomatically she is doing  well, she denies chest pain, dyspnea, orthopnea, PND, edema,  lightheadedness, or syncope.   MEDICATIONS:  1. Aspirin 81 mg daily.  2. Multivitamin daily.  3. Caltrate 600 mg plus D 2 tabs daily.  4. Simvastatin 20 mg daily.  5. Actonel 35 mg weekly.  6. Synthroid 75 mcg daily.  7. Amlodipine 10 mg daily.  8. Benicar 40 mg daily.  9. Furosemide 40 mg daily.  10.Allopurinol 300 mg daily.  11.Pantoprazole 40 mg daily.   PHYSICAL EXAMINATION:  Heather Morrison is an alert and oriented elderly woman  in no acute distress.  She is very sharp.  Weight is 154 pounds, blood  pressure is 116/58, heart rate  68.  HEENT:  Normal.  NECK:  Normal, carotid upstrokes without bruits.  Jugular venous  pressure is normal.  LUNGS:  Clear to auscultation bilaterally.  HEART:  Regular rate and rhythm without murmurs or gallops.  The apex is  discreet and nondisplaced.  ABDOMEN:  Soft, nontender, no organomegaly, no bruits.  EXTREMITIES:  There is no clubbing, cyanosis, or edema.  There are  extensive lower extremity varicosities and spider veins.  Peripheral  pulses are diminished in the feet.   ASSESSMENT:  Heather Morrison is currently stable from a cardiovascular  standpoint.  Her pertinent problems are as follows:  1. Coronary artery disease.  Her most significant lesion was a 50% -      60% lesion in the left anterior descending.  She is appropriately      being treated with medical therapy.  She should continue with      aspirin, simvastatin, and aggressive treatment of her hypertension.      She is  not on a beta blocker but is really not indicated that she      has no angina and no prior myocardial infarction.  2. Dyslipidemia.  She is followed by Dr. Alwyn Ren.  She is currently on      simvastatin 20 mg daily.  Her goal LDL should be less than 100.  3. Renal artery stenosis.  I suspect her unilateral renal artery      stenosis is unrelated to her renal insufficiency which is likely      due to longstanding hypertension and advanced age.  Her blood      pressure control is optimal and ongoing medical therapy is      indicated.  4. Hypertension.  Blood pressure is under ideal control under      treatment as initiated by Dr. Alwyn Ren.   For followup I would like to see Ms. Schild back in 6 months or sooner if  any new problems arise.     Veverly Fells. Excell Seltzer, MD  Electronically Signed    MDC/MedQ  DD: 12/13/2006  DT: 12/13/2006  Job #: [id number]   cc:   Titus Dubin. Alwyn Ren, MD,FACP,FCCP

## 2010-10-10 NOTE — H&P (Signed)
Heather Morrison, Heather Morrison                ACCOUNT NO.:  [id number]   MEDICAL RECORD NO.:  [id number]          PATIENT TYPE:  EMS   LOCATION:  MAJO                         FACILITY:  [facility]   PHYSICIAN:  Reginia Forts, MD     DATE OF BIRTH:  03/17/1917   DATE OF ADMISSION:  08/29/2006  DATE OF DISCHARGE:                              HISTORY & PHYSICAL   CHIEF COMPLAINT:  Chest pain.   Heather Morrison is an 77 year old Caucasian woman with a history of  hypertensive urgency and noncardiac chest pain who presents with 1 day  of substernal chest pressure with radiation to left arm.  Patient has  had episodes of hypertensive urgency dating back to February of 2008.  During that hospitalization, she ruled out for a myocardial infarction  and underwent an outpatient Thallium stress test, which was reportedly  negative.  She has had no recurrence of symptoms since amlodipine was  started and improved her blood pressure.  Last night, patient was  working out on her exercise bike beyond her normal routine.  During that  time, she had no symptoms.  She was awakened this morning at 4:30 a.m.  with 5/10 substernal chest pressure radiating to the left shoulder.  The  pain lasted all day and did wax and wane, but was not associated with  any dyspnea on exertion, shortness of breath or diaphoresis.  Patient  was not able to correlate anything that significantly improved or  exacerbated her symptoms, including food or deep breaths.  She was  subsequently brought to the emergency room tonight, where the pain  subsided after a nitroglycerin drip was started.  Patient denied any  worsening symptoms with range of motion, activity.  She denies any  orthopnea or paroxysmal nocturnal dyspnea or syncope.   PAST MEDICAL HISTORY:  1. Hypertensive urgency.  2. Degenerative joint disease.  3. Hypothyroidism.  4. Gastroesophageal reflux disease.  5. Sinus bradycardia.   PAST SURGICAL HISTORY:  1. Status post  hysterectomy.  2. Total right hip arthroplasty in 2001.  3. Remote venous stripping.   ALLERGIES:  NO KNOWN DRUG ALLERGIES.   MEDICATIONS:  Are:  1. Benicar 40/12.5 mg daily.  2. Synthroid 50 mcg p.o. daily.  3. Amlodipine/benazepril combo 5 mg/10 mg daily.  4. Aspirin 81 mg daily.  5. Simvastatin 20 mg daily.   SOCIAL HISTORY:  Patient lives in [place] alone.  She is widowed.  Her husband expired approximately 1 year ago.  She is retired from a  Investment banker, corporate.  Denies any history of alcohol, drug  use or tobacco.   FAMILY HISTORY:  Notable for no significant coronary disease.   REVIEW OF SYSTEMS:  Positive for chronic hearing loss, depression since  her husband's death, right hand arthralgia and nocturia once a night.  The rest of 12 review of systems is reviewed and is negative.   PHYSICAL EXAMINATION:  VITAL SIGNS:  Temperature is 97.8.  Pulse is 55.  Respiratory rate is 16.  Blood pressure 145/60.  IN GENERAL:  Patient is awake, alert and oriented x3 in no acute  distress, appears healthy for her stated age.  HEENT:  Normocephalic, atraumatic.  Pupils equal, round and reactive to  light.  Extraocular movements are intact.  NECK:  Shows no JVD.  No carotid bruits.  CARDIOVASCULAR:  Regular rhythm.  Normal rate.  No murmurs, rubs or  gallops.  LUNGS:  Clear to auscultation bilaterally.  ABDOMEN:  Positive bowel sounds.  Soft, nontender, nondistended.  EXTREMITIES:  Show no cyanosis, clubbing or edema.  Positive varicose  veins and 1+ distal pulses and 1+ femoral pulses.  No bruits.  MUSCULOSKELETAL:  Demonstrates no joint deformities or effusions.  NEURO:  Cranial nerves II-XII grossly intact.  No focal musculoskeletal  or sensory deficits.   Chest x-ray demonstrates no acute cardiopulmonary process.  EKG  demonstrates sinus brady with a heart rate of 54.  No ST changes and no  hypertrophy.   LABS:  Demonstrate a BUN of 34, creatinine of 1.5,  potassium 3.8.  Troponin is less than 0.05.  CK is 110, MB is 1.7.   ASSESSMENT/PLAN:  This is an 77 year old Caucasian woman with a history  of hypertensive urgency and a recent negative stress test, who now  presents with atypical chest pain.   1. Chest pain.  We will rule the patient out for a myocardial      infarction.  Likely cause of her symptoms is musculoskeletal versus      gastrointestinal; therefore, patient will be transitioned off the      nitroglycerin drip and placed on a combination of Tylenol for      musculoskeletal pain, Protonix for gastroesophageal reflux disease      and Imdur possibly for microvascular disease versus esophageal      spasm.  At this time, we will not anticoagulate with IV heparin      unless the patient rules in or there is any evidence of ischemia.  2. Hypertension.  Blood pressure appears to be controlled.  Patient      will continue on her home medications.  3. Deep venous thrombosis prophylaxis.  Patient will be placed on      subcu heparin.      Reginia Forts, MD  Electronically Signed     RA/MEDQ  D:  08/29/2006  T:  08/30/2006  Job:  [id number]

## 2010-10-10 NOTE — Op Note (Signed)
NAME:  Heather Morrison, Heather Morrison                          ACCOUNT NO.:  [id number]   MEDICAL RECORD NO.:  [id number]                   PATIENT TYPE:  INP   LOCATION:  Y782                                 FACILITY:  [facility]   PHYSICIAN:  Ollen Gross, M.D.                 DATE OF BIRTH:  1916-12-03   DATE OF PROCEDURE:  05/29/2002  DATE OF DISCHARGE:                                 OPERATIVE REPORT   PREOPERATIVE DIAGNOSIS:  Osteoarthritis, right hip.   POSTOPERATIVE DIAGNOSIS:  Osteoarthritis, right hip.   PROCEDURE:  Right total hip arthroplasty.   SURGEON:  Ollen Gross, M.D.   ASSISTANT:  Alexzandrew L. Julien Girt, P.A.   ANESTHESIA:  Spinal.   ESTIMATED BLOOD LOSS:  250.   DRAINS:  Hemovac x1.   COMPLICATIONS:  None.   CONDITION:  Stable to recovery.   CLINICAL NOTE:  The patient is an 77 year old female with severe end-stage  osteoarthritis of the right hip with pain refractory to nonoperative  management.  She presents now for right total hip arthroplasty.   DESCRIPTION OF PROCEDURE:  After the successful administration of spinal  anesthetic, the patient was placed in the left lateral decubitus position  with the right side up and held with the hip positioner.  The right lower  extremity was isolated from her perineum with plastic drapes and prepped and  draped in the usual sterile fashion.  A min-posterolateral incision was made  with a 10 blade through subcutaneous tissue to the level of the fascia lata,  which was incised in line with the skin incision.  The sciatic nerve was  palpated and protected and the short rotators isolated off the femur.  The  capsule was then excised and the hip dislocated.  The center of the femoral  head is marked such that in any trials placed, the center of the trial head  corresponds to the center of her native femoral head.  Osteotomy line is  marked on the femoral neck and osteotomy made with an oscillating saw.  The  femur was then  retracted anteriorly and acetabular exposure obtained.   The labrum was removed.  Acetabular reaming starts with a 47, coursing in  increments of two to a 53, then a 54 mm Pinnacle acetabular shell is placed  anatomic position and transfixed with two domed screws.  A trial 32 mm  neutral liner is placed.   The canal finder is used to find the canal, then broaching starts with a  size 1, then a size 2.  A size 3 standard-offset trial neck is placed with a  32 +1 head.  The hip is reduced with outstanding stability, full extension,  full external rotation, 70 degrees flexion and 40 degrees abduction at 90  degrees internal and 90 degrees flexion at 70 degrees internal rotation.  The hip is then dislocated and the cement restrictor trial is placed.  A  size 4 is most appropriate, then the size 4 restrictor is placed at the  appropriate depth in the femoral canal for a size 3 stem.  The permanent  apex hole eliminator and permanent 32 mm neutral Marathon liner is then  placed in the acetabular shell.  A sponge is placed in the acetabulum and  then the femoral canal prepared with pulsatile lavage.  Cement is mixed and  once ready for implantation is injected into the canal and pressurized.  The  size 3 standard-offset Endurance Luster stem is then cemented into the  femoral canal, matching her native anteversion.  Once the cement is fully  hardened, a trial 32 +1 head is placed and the same stability parameters are  noted.  A permanent 32 +1 head is placed and the hip reduced with the same  parameters.  The wound is copiously irrigated with antibiotic solution and  short rotators reattached to the femur through drill holes.  The fascia lata  is closed over a Hemovac drain with interrupted #1 Vicryl, subcu closed with  #1 and 2-0 Vicryl, and subcuticular running 4-0 Monocryl.  The incision is  cleaned and dried and Steri-Strips and a bulky sterile dressing applied.  The patient is awakened and  transported to recovery in stable condition.                                               Ollen Gross, M.D.    FA/MEDQ  D:  05/29/2002  T:  05/29/2002  Job:  [id number]

## 2010-10-10 NOTE — Discharge Summary (Signed)
Heather Morrison, Heather Morrison                ACCOUNT NO.:  [id number]   MEDICAL RECORD NO.:  [id number]          PATIENT TYPE:  INP   LOCATION:  3314                         FACILITY:  [facility]   PHYSICIAN:  Valerie A. Felicity Coyer, MDDATE OF BIRTH:  1916-11-16   DATE OF ADMISSION:  02/02/2006  DATE OF DISCHARGE:  02/03/2006                                 DISCHARGE SUMMARY   DISCHARGE DIAGNOSES:  1. Chest pain in the setting of uncontrolled hypertension.  2. Uncontrolled hypertension.  3. Bradycardia.   HISTORY OF PRESENT ILLNESS:  Heather Morrison is an 77 year old white female who is  brought to [facility] via EMS, secondary to left-sided chest pain, which is  radiating down to her left shoulder blade.  Her blood pressure, at home, was  238/93.  The patient was admitted for further evaluation and treatment.   PAST MEDICAL HISTORY:  1. Hypertension.  2. Osteoarthritis.  3. Hypothyroid.  4. Hysterectomy.   COURSE OF HOSPITALIZATION:  1. Chest pain in the setting of uncontrolled hypertension.  The patient      was admitted and was placed on a nitro drip for BP control.  Her blood      pressure improved.  She also underwent serial cardiac enzymes, which      were negative.  It was felt that her symptoms were likely secondary to      musculoskeletal pain.  She was evaluated by Dr. Dietrich Pates of [facility] during this hospitalization and she was scheduled for an      outpatient stress test.  It was also recommended that due to patient's      bradycardia down into the 40s during this admission that her beta      blocker be changed to a calcium channel blocker.  He did feel that if a      beta blocker is ultimately required, pindolol could be tried.  At this      time, patient's blood pressure is stable on Benicar HCTZ and Synthroid.      She is instructed to discontinue Toprol.   PERTINENT LABORATORIES AT DISCHARGE:  BUN 28, creatinine 1.2.  Serial  cardiac enzymes negative.  Hemoglobin 13.3,  hematocrit 39.0.   DISPOSITION/PLAN:  Transfer patient to home.   MEDICATIONS AT DISCHARGE:  1. Aspirin 325 mg p.o. daily.  2. Benicar HCT 40/12.5 mg p.o. daily.  3. Synthroid 50 mcg on Mondays, Wednesdays, Fridays and Saturdays and 75      mcg on Tuesdays, Thursdays and Sundays.  4. Norvasc 5 mg p.o. daily.  5. Multivitamin 1 tab p.o. daily.  6. Caltrate 600 mg plus vitamin D one tablet twice daily.   FOLLOWUP:  The patient is instructed to followup with Dr. Clearance Coots on  September 24 at 1 o'clock p.m.  She is also scheduled for an outpatient  Myoview on February 10, 2006 at 11:30 a.m.  She is instructed to return to  the emergency room should she develop worsening chest pain.     ______________________________  Sandford Craze, PA      Raenette Rover.  Felicity Coyer, MD  Electronically Signed    MO/MEDQ  D:  02/03/2006  T:  02/03/2006  Job:  [id number]   cc:   Titus Dubin. Alwyn Ren, MD,FACP,FCCP

## 2010-10-10 NOTE — Cardiovascular Report (Signed)
[name], [name]                ACCOUNT NO.:  [id number]   MEDICAL RECORD NO.:  [id number]          PATIENT TYPE:  INP   LOCATION:  4707                         FACILITY:  [facility]   PHYSICIAN:  Veverly Fells. Excell Seltzer, MD  DATE OF BIRTH:  Jul 03, 1916   DATE OF PROCEDURE:  08/30/2006  DATE OF DISCHARGE:                            CARDIAC CATHETERIZATION   PROCEDURE:  Selective right renal angiography.   INDICATIONS:  [name] is an 77 year old woman who is was undergoing a  diagnostic catheterization by Dr. Gala Romney.  She has had refractory  hypertension, despite multi-drug antihypertensive therapy.  She was  referred for renal angiogram at that time of her diagnostic coronary  angiogram.   Her nonselective aortogram was suggestive of high-grade right renal  artery stenosis.   Using a JR-4 5-French catheter, the right renal artery was imaged.  The  catheter did not quite reach the renal ostium, so a 5-French LIMA  catheter was used to selectively engage the right renal artery.  A  single angiographic view of the right renal artery was taken.   FINDINGS:  There is a 70% focal ostial stenosis of the right renal  artery with post stenotic dilatation.  The renal artery is normal for  the remainder of its course, and bifurcates into a 2 main branches  supplying the right kidney.   CONCLUSION:  Significant right renal artery stenosis.   RECOMMENDATIONS:  Case was reviewed with Dr. Gala Romney.  We will  consider bringing [name] state back and staging a right renal stent  in this.  She should have some time in between her diagnostic procedures  due to her advanced age and renal insufficiency.      Veverly Fells. Excell Seltzer, MD  Electronically Signed     MDC/MEDQ  D:  08/30/2006  T:  08/30/2006  Job:  [id number]

## 2010-10-10 NOTE — Assessment & Plan Note (Signed)
[facility]                            CARDIOLOGY OFFICE NOTE   Morrison, Heather                       MRN:          [id number]  DATE:09/15/2006                            DOB:          [birth year]    Heather Morrison was seen as an outpatient and in hospital follow up at the  [facility] cardiology office on September 15, 2006. She was hospitalized from  April 6 through April 8 with an acute coronary syndrome, as well as  marked hypertension. Her diagnostic cardiac catheterization revealed non-  obstructive disease, throughout her major epicardial vessels, with the  most significant lesion being a 50-60% lesion in the mid LAD. She had a  super renal aortogram performed that was suggestive of high grade right  renal artery stenosis and a selective renal angiogram was subsequently  done which demonstrated a 70-80% lesion with post stenotic dilatation of  the right renal artery. She also underwent ABIs in the hospital that  showed an ABI of 0.6 on the right and 0.72 on the left suggestive of  moderate bilateral disease.   At today's visit, Heather Morrison reports that she has been doing well. She  has had an episode of chest pain since her return home, but it was short  lived. She continues to have bilateral calf pain with walking uphill.  She brings in her blood pressure readings and her blood pressure has  been reasonably well controlled with a range in the 130s-140s systolic  over 60s-70s diastolic.   CURRENT MEDICATIONS:  1. Benicar HCT 40/12.5 mg daily.  2. Aspirin 81 mg daily.  3. Multivitamin daily.  4. Caltrate +D 2 tablets daily.  5. Simvastatin 20 mg daily.  6. Actonel 35 mg weekly.  7. Synthroid.  8. Amlodipine 10 mg daily.   ALLERGIES:  No known drug allergies.   PHYSICAL EXAMINATION:  GENERAL:  The patient is alert and oriented. She  is no acute distress. She is an elderly woman.  VITAL SIGNS:  Weight 157 pounds. Her blood pressure is 140/60 in  the  right arm, and 140/64 in the left arm. Heart rate 55, respiratory rate  16.  HEENT:  Normal.  NECK:  Normal carotid upstrokes with soft bilateral carotid bruits.  LUNGS:  Clear to auscultation bilaterally.  HEART:  Regular rate and rhythm. There are no murmurs or gallops.  ABDOMEN:  Soft and nontender, no organomegaly, no abdominal bruits.  EXTREMITIES:  There is no cyanosis, clubbing, or edema. There are marked  varicosities in both legs. There is dependent ruber of both feet. Pedal  pulses are diminished.   EKG demonstrates normal sinus rhythm and is within normal limits.   LABORATORY DATA:  Labs reviewed from April 14 show a creatinine of 1.3  and a BUN of 34.   An echocardiogram from the hospital showed normal LV systolic function  with an EF of 55%. There was mild LVH and a mild increase in estimated  peak PA pressures.   ASSESSMENT:  Heather Morrison is currently stable from a cardiovascular  standpoint. Her problems are as follows:  1. Non-obstructive coronary artery disease. She should maintain her      current medical therapy.  2. Right renal artery stenosis. We had a long discussion today. Ms.      Morrison is somewhat reluctant to pursue another invasive procedure. I      think that renal artery stenting has a potential of improving her      blood pressure control. She is currently on three medications and      her systolic pressures are mildly elevated. Her renal insufficiency      is unlikely to be affected in the setting of unilateral renal      artery stenosis. I will review the situation with Dr. Alwyn Ren and      then I will call Ms. Zeien on the telephone and discuss further      plans.  3. Hypertension. See discussion above. Currently stable on three drug      therapy.  4. Peripheral arterial disease with abnormal ABIs and typical      claudication symptoms. We will continue with medical therapy at      this point. Considering Heather Morrison's age, I think aggressive       revascularization for claudication symptoms has more potential risk      than benefit.   I plan on seeing Heather Morrison back in the office in three months. Again, I  will review her situation with Dr. Alwyn Ren and again discuss treatment  options with the patient to consider whether renal artery intervention  should be considered.     Veverly Fells. Excell Seltzer, MD  Electronically Signed    MDC/MedQ  DD: 09/15/2006  DT: 09/16/2006  Job #: [id number]

## 2010-10-10 NOTE — Discharge Summary (Signed)
NAME:  Heather Morrison, Heather Morrison                          ACCOUNT NO.:  [id number]   MEDICAL RECORD NO.:  [id number]                   PATIENT TYPE:  INP   LOCATION:  0482                                 FACILITY:  [facility]   PHYSICIAN:  Ollen Gross, M.D.                 DATE OF BIRTH:  1917-02-15   DATE OF ADMISSION:  05/29/2002  DATE OF DISCHARGE:  06/04/2002                                 DISCHARGE SUMMARY   ADMISSION DIAGNOSES:  1. Severe osteoarthritis right hip.  2. Hypertension.  3. History of pneumonia.  4. Osteoporosis.  5. Hypothyroidism.   DISCHARGE DIAGNOSES:  1. Osteoarthritis right hip status post right total hip replacement and     arthroplasty.  2. Postoperative blood loss anemia.  3. Mild postoperative hyponatremia.  4. Hypertension.  5. History of pneumonia.  6. Osteoporosis.  7. Hypothyroidism.   OPERATIVE PROCEDURE:  The patient was taken to the operating room on 05/29/02  and underwent a right total hip replacement arthroplasty.   SURGEON:  Ollen Gross, M.D.   ASSISTANT:  Alexzandrew L. Perkins, P.A.-C.   ANESTHESIA:  Spinal anesthesia.   ESTIMATED BLOOD LOSS:  250 mL.   DRAINS:  Hemovac drains x1.   CONSULTATIONS:  Rehabilitation Services, Dr. Thersa Salt.   BRIEF HISTORY:  The patient is an 77 year old female who comes in and is  evaluated by Dr. Ollen Gross for ongoing right hip pain, been ongoing for  several years now, and has progressively become worse.  She is seen in the  office where x-rays show erosive end-stage arthritis with bone-on-bone  changes.  She is very active, however the pain has interfered with her daily  activities, and felt she would benefit by undergoing a hip replacement.  Risks and benefits were discussed.  The patient was subsequently admitted to  the hospital.   LABORATORY DATA:  CBC showed an admission hemoglobin 13.6, hematocrit 39.5,  WBC 6.2, red cell count 4.45, differential within normal limits.  Postoperative H&H 10.3  and 29.7, last noted H&H 9.3 and 26.6.  PT/PTT on  admission 12.5 and 31 respectively.  The INR was 0.9.  Last noted serum pro  time followed by Coumadin protocol.  Last noted PT/INR 23.5 and 2.4.  ________ on admission all within normal limits.  Followup BMET showed a drop  down to sodium of 135, last noted sodium was 133.  Glucose went up from 84  to 145, back down to 121.  Calcium dropped to 9.3 to 8.1, back up to 8.4.  Urinalysis on admission was negative.  Blood group type O positive.   EKG dated 05/24/02; sinus bradycardia with sinus arrythmia, otherwise  normal.  No old tracing to compare.  Confirmed by Dr. Osvaldo Shipper. Spruill.  Preoperative chest x-ray dated 05/24/02:  Cardiomegaly, no active disease.  Pelvis film:  Degenerative changes of the right hip along with right hip  film show advanced  degenerative changes of the right hip.  Postoperative  pelvis and hip films taken on 05/29/02, anatomic alignment status post right  total hip replacement arthroplasty.   HOSPITAL COURSE:  The patient was admitted to [facility] on  05/29/02, and underwent the above procedure without complications.  The  patient tolerated the procedure well and later returned to the recovery room  and then to the orthopedic floor for continued postoperative care.  Surgery  was done under spinal anesthesia, Hemovac drain x1.  Hemovac placed at the  time of the surgery was pulled on postoperative day one, she was given 24  hours of postoperative IV antibiotics and placed on Coumadin for DVT  prophylaxis.  Physical therapy and occupational therapy were consulted  postoperatively to assist with gait training ambulation and ADL's.  The  patient also underwent a rehab service consult by Dr. Thersa Salt, who felt that  she may need intensive inpatient rehabilitation stay.  She was seen by Dr.  Thersa Salt and felt that she may need inpatient rehab versus [facility], rehab  services followed along during her hospital course.   On postoperative day  two she did have some nausea, which was treated with antiemetics, while she  was up and about.  A dressing change was initiated on postoperative day two,  incision was healing well, PCA which was placed for postoperative care after  surgery was discontinued by postoperative day two, when she was weaned over  to p.o. medications.  The patient was slow to progress to physical therapy,  up ambulating only about 50 feet by postoperative day two, she continued to  progress and was up to approximately 140 feet by postoperative day three.  She started to progress very well by postoperative day three, it was noted  at that time that a bed did become available on the SACU unit, however after  speaking to the patient's family, she felt that she was doing very well, and  that she herself felt that she could go home and have her family assist,  therefore arrangements were to be made for her to do home health physical  therapy.  She continued to progress well and was noted however though, after  the patient gave up her chance to go [facility], that the patient's son and family  did want her to go to [facility] and felt that she may not be safe by herself at  home.  She was placed back in for the possibility of a SACU bed,  unfortunately another bed did not become available.  She did however  continue to progress well with physical therapy, and only required minimal  assist.  She was seen on 06/04/02, she was doing quite well with her  mobility, she was tolerating p.o. medications and she was to be discharged.  Arrangements were made for home health and she was released from the  hospital.   DISCHARGE PLAN:  The patient was discharged to home on 06/04/02.   DISCHARGE DIAGNOSES:  Please see above.   DISCHARGE MEDICATIONS:  1. Coumadin.  2. Percocet.   DISCHARGE DIET:  As tolerated.  DISCHARGE ACTIVITIES:  Touchdown weightbearing.  Home health physical  therapy and home health nursing and  occupational therapy for gait training,  ambulation, activities of daily living, and Coumadin protocol.   FOLLOW UP:  Two weeks from surgery, call the office for an appointment.   DISPOSITION:  Home.   DISCHARGE CONDITION:  Improved.     Alexzandrew L. Perkins, P.A.  Ollen Gross, M.D.    ALP/MEDQ  D:  07/10/2002  T:  07/10/2002  Job:  [id number]   cc:   Ollen Gross, M.D.  [address]  [place]  [place] [address]  Fax: [phone]   Alphonsus Sias

## 2010-10-10 NOTE — Cardiovascular Report (Signed)
NAMEMATALYN, NAWAZ                ACCOUNT NO.:  [id number]   MEDICAL RECORD NO.:  [id number]          PATIENT TYPE:  INP   LOCATION:  4707                         FACILITY:  [facility]   PHYSICIAN:  Bevelyn Buckles. Bensimhon, MDDATE OF BIRTH:  10/07/1916   DATE OF PROCEDURE:  08/30/2006  DATE OF DISCHARGE:                            CARDIAC CATHETERIZATION   PRIMARY CARE PHYSICIAN:  Dr. Alwyn Ren.   PATIENT IDENTIFICATION:  Heather Morrison is a delightful 77 year old woman who  is quite active.  There is no history of coronary artery disease.  She  was admitted in February for hypertensive urgency and blood pressure  regimen was changed.  Since that time she has had reasonable control of  her blood pressure.  She was admitted overnight with chest pain that  woke her from sleep.  EKG and cardiac markers were unremarkable.  She  was brought to the catheterization lab due to the nature of her symptoms  to evaluate her coronary anatomy as well as her renal arteries.   PROCEDURES PERFORMED:  1. Selective coronary angiography.  2. Left heart catheterization.  3. Abdominal aortogram.   Of note, a ventriculogram was not performed in an effort to limit  contrast exposure.   DESCRIPTION OF PROCEDURE:  The risks and benefits of catheterization  were explained, consent was signed and placed on the chart.  A 5-French  arterial sheath was placed in right femoral artery using a modified  Seldinger technique.  Standard catheters including a JL-4, JR-4 and  angled pigtail were used for catheterization.  All catheter exchanges  made over wire.  There are no apparent complications.  Central aortic  pressure is 134/56 with a mean of 86.  LV pressure is 137/zero with an  EDP of 6.  There is no aortic stenosis.   Left main was normal.   LAD was a long vessel wrapping the apex, it gave off two small  diagonals.  In the proximal portion of the LAD there was a tubular 40%  lesion.  In the midportion there was a  50-60% eccentric plaque.  In the  ostium of the first diagonal there is a 70-80% tubular lesion.   Left circumflex was a moderate-sized system, gave off a large branching  OM-1, a small OM-2.  There was a 30% lesion in the proximal circumflex.   Right coronary artery was large dominant vessel, gave off an RV branch,  dual PDA system and posterolateral.  There was a 30% lesion proximally.   Abdominal aortogram shows moderate abdominal aortic and iliac plaquing  with no aneurysm.  Left renal appears okay.  The right renal probable  90% ostial stenosis.   ASSESSMENT:  1. Mild to moderate nonobstructive coronary artery disease with high-      grade lesion in small diagonal which is not amenable to      percutaneous intervention.  2. Peripheral arterial disease with question high-grade right renal      artery stenosis and moderate abdominal aortic iliac plaquing.   PLAN:  Would proceed with medical therapy for Heather Morrison's coronary  disease.  I have  asked Dr. Excell Seltzer to selectively inject her right renal  artery to evaluate clearly for possible high-grade stenosis.  Would  check a 2-D echocardiogram to evaluate her LV function.   ADDENDUM:  Dr. Excell Seltzer has performed selective right renal angiography  which shows a high-grade 70-80% ostial right renal artery stenosis with  some post stenotic dilatation.  If her hypertension proves to be  refractory one could consider an elective percutaneous intervention on  this.      Bevelyn Buckles. Bensimhon, MD  Electronically Signed     DRB/MEDQ  D:  08/30/2006  T:  08/30/2006  Job:  [id number]   cc:   Dr. Alwyn Ren

## 2010-10-10 NOTE — H&P (Signed)
NAMEDWIGHT, BURDO NO.:  [id number]   MEDICAL RECORD NO.:  [id number]          PATIENT TYPE:  EMS   LOCATION:  MAJO                         FACILITY:  [facility]   PHYSICIAN:  Rod Holler, MD     DATE OF BIRTH:  [birth year]   DATE OF ADMISSION:  02/02/2006  DATE OF DISCHARGE:                                HISTORY & PHYSICAL   CHIEF COMPLAINT:  Chest pain.   HISTORY OF PRESENT ILLNESS:  Ms. Chesbro is a very pleasant 77 year old female  who presented to the emergency department secondary to chest discomfort.  The patient was working around her house vigorously today.  When she was  going to bed tonight, the patient noted that she was very anxious and also  had some left sided chest pressure.  The chest pressure radiated to her left  shoulder area.  She had no associated nausea or diaphoresis.  No associated  shortness of breath.  She had no back discomfort.  She said her blood  pressure at home and her systolic blood pressure was in the 230's.  She  called 911 and by EMS her systolic blood pressure was in the 230's.  The  patient was given a nitroglycerin tablet and was also given an aspirin.  Upon arrival to the emergency department, the patient's systolic blood  pressure was in the 200's.  She was started on a nitroglycerin drip, and the  pressure in her chest is much improved at present, and her systolic blood  pressure is now in the 180's.  She has had no blurred vision or loss of  vision.  No slurred speech.  No paresthesias.   PAST MEDICAL HISTORY:  1. Hypertension.  2. Osteoarthritis.  3. Hypothyroidism.  4. Status post hysterectomy.   MEDICATIONS:  1. Benicar/HCT 40/12.5 one tablet p.o. daily.  2. Synthroid 50 mcg p.o. Monday, Wednesday, Friday and Saturday; 75 mcg      p.o. Tuesday, Thursday, Sunday.  3. Toprol XL 50 mg p.o. daily.   ALLERGIES:  No known drug allergies.   SOCIAL HISTORY:  The patient is a nonsmoker, does not drink alcohol,  lives  at home alone.   FAMILY HISTORY:  No known history of premature coronary artery disease.   REVIEW OF SYSTEMS:  All systems were reviewed in detail and are negative  except as noted in the history of present illness.   PHYSICAL EXAMINATION:  VITAL SIGNS:  Blood pressure 203/89 on presentation  to the emergency room, currently 180/90, heart rate in the 60's, respiratory  rate 12, oxygen saturation 97% on room air, temperature 97.9.  GENERAL:  Well-developed, well-nourished female, appears younger than stated  age, alert and oriented, no apparent distress.  HEENT:  Normocephalic, atraumatic.  Pupils equal, round and reactive to  light.  Extraocular movements intact.  Oropharynx clear.  NECK:  Supple.  No adenopathy.  No JVD.  No carotid bruits.  CHEST:  Lungs clear to auscultation bilaterally with equal bilateral breath  sounds.  CARDIAC:  Bradycardic, regular, no murmurs, rubs or gallops, 2+ peripheral  pulses.  ABDOMEN:  Soft, nontender, nondistended, active bowel sounds.  No  hepatosplenomegaly.  EXTREMITIES:  No cyanosis, clubbing or edema.  NEUROLOGICAL:  No focal deficits.  SKIN:  No rashes.   LABORATORY DATA:  White blood cell count 6.2, hematocrit 37.9, platelets  158,000  Sodium 140, potassium 4.1, chloride 108, bicarbonate 28, BUN 28,  creatinine 1.2, glucose 100, CK MB 1.9, troponin less than 0.05.  EKG shows  sinus bradycardia with nonspecific ST-T wave changes.   IMPRESSION:  An 77 year old female who presents with hypertensive urgency.   PLAN:  1. Admit the patient to a stepdown unit.  2. Pulmonary, follow up chest x-ray.  3. Cardiovascular:  Rule out with serial cardiac enzymes.  Titrate      nitroglycerin drip to systolic blood pressure in the 140's to 160's.      Continue the patient's home does of Benicar/HCT and Toprol XL, aspirin      daily.  Given systolic blood pressures in the 230's, will hold      anticoagulation.  Get a lipid panel in am .  4.  Endocrine:  Thyroid function tests, continue high does of Synthroid.  5. F/E/N: NPO after midnight      Rod Holler, MD  Electronically Signed     TRK/MEDQ  D:  02/02/2006  T:  02/02/2006  Job:  [id number]

## 2010-10-10 NOTE — Consult Note (Signed)
Heather Morrison, Heather Morrison                ACCOUNT NO.:  [id number]   MEDICAL RECORD NO.:  [id number]          PATIENT TYPE:  INP   LOCATION:  3314                         FACILITY:  [facility]   PHYSICIAN:  Gerrit Friends. Dietrich Pates, MD, FACCDATE OF BIRTH:  01-05-1917   DATE OF CONSULTATION:  02/03/2006  DATE OF DISCHARGE:                                   CONSULTATION   REFERRING PHYSICIAN:  Dr. Felicity Coyer   PRIMARY CARE PHYSICIAN:  Dr. Alwyn Ren   HISTORY OF PRESENT ILLNESS:  An 77 year old woman admitted to hospital with  hypertensive urgency and chest discomfort; subsequently sinus bradycardia  has been noted.  Heather Morrison has no prior history of significant cardiac  disease.  She has had longstanding hypertension that is generally at least  fairly well controlled.  She has never previously been evaluated by a  cardiologist nor undergone any significant cardiac testing.  She does not  usually have any dyspnea nor chest discomfort, but developed aching in her  upper left chest radiating to the left shoulder on the day of admission.  This was of an insidious onset and was mild to moderate in intensity.  There  were no associated symptoms.  She attributed it to unaccustomed activity,  which she had carried out the day before.  Unable to sleep, she took her  blood pressure late at night and found it to be quite elevated with a  systolic pressure approaching 962 and a diastolic pressure above 90.  She  called a family member who is a Engineer, civil (consulting).  EMS was summoned and transported the  patient to the emergency department.  EKGs have not shown any acute changes.  Cardiac markers have been negative.  Her discomfort has subsequently faded.   On telemetry, she has not had any significant arrhythmias, but has had a  persistent sinus bradycardia in the upper 40s and low 50s.  She has  generally been told of a fairly low heart rate.  When she takes her blood  pressure at home, the device generally reads heart rates in  the mid 50s.   MEDICATIONS AT THE TIME OF ADMISSION:  Included:  1. Benicar HCT 40/12.5 mg daily.  2. Toprol-XL 50 mg daily.  3. Levothyroxine 50-75 mcg daily.  4. Actonel 35 mg weekly.  5. Caltrate and vitamin D and a multivitamin.   The patient denies any noncompliance with her medical regime.  She has no  known allergies.   PAST MEDICAL HISTORY:  Is otherwise notable for DJD with osteoarthritis of  the joints of the lower extremities.  She has had hypothyroidism.  Surgical  procedures have included hysterectomy, total right hip arthroplasty in 2001  and remote venous stripping.   SOCIAL HISTORY:  Lives alone in [place]; her husband of 67 years died  within the past year or so.  She is retired from work in Clinical biochemist at  [employer].  She has not used tobacco products nor significant  amounts of alcohol.   FAMILY HISTORY:  No prominent cardiovascular disease.   REVIEW OF SYSTEMS:  Is notable for recent  5-pound weight loss, a recent  dental extraction, some chronic hearing loss, the need for corrective  lenses, nocturia once per night, chronic numbness in the toes of her right  foot, some depression since her husband's death, and arthralgias of the  right hand.  She has GERD for which she occasionally uses Tums.  All other  systems reviewed and are negative.   EXAMINATION:  GENERAL:  Pleasant, remarkably intact older woman in no acute  distress.  VITAL SIGNS:  The temperature is 97.5, heart rate 48 and regular,  respirations 16, blood pressure 160/50.  HEENT:  Anicteric sclerae; normal lids and conjunctivae.  Normal oral  mucosa.  NECK:  No jugular venous distension; normal carotid upstrokes without  bruits.  ENDOCRINE:  No thyromegaly.  HEMATOPOIETIC:  No adenopathy.  SKIN:  No significant lesions.  LUNGS:  Clear.  CARDIAC:  Normal first and second heart sounds, modest systolic ejection  murmur.  ABDOMEN:  Soft and nontender; no organomegaly; no  masses; no bruits.  EXTREMITIES:  Skin pigmentation over the medial right ankle and lateral left  ankle; marked varicosities in the left thigh; trace edema.  NEUROMUSCULAR:  Symmetric strength and tone; normal cranial nerves.  MUSCULOSKELETAL:  No joint deformities.   EKG:  Sinus bradycardia; minor nonspecific ST-segment abnormality; slightly  delayed R-wave progression; left atrial abnormality.  EKG:  Cardiomegaly; vascular redistribution.   CBC normal.  Chemistry profile normal.  TSH 4.  Cardiac markers negative.   IMPRESSION:  Heather Morrison presents with chest discomfort that has some  worrisome features for possible myocardial ischemia accompanied by a  markedly-elevated blood pressure.  It is not possible to determine whether  her hypertension was primary or secondary.  Some reassurances granted by a  normal EKGs during symptoms and normal cardiac markers.  Nonetheless,  ischemic heart disease is certainly a consideration.  The patient will need  a stress nuclear study, but this can be done on an outpatient basis.   She has an asymptomatic sinus bradycardia of moderate severity.  This is  certainly exacerbated by use of beta blocker.  At this point it is not a  major consideration, but further slowing is likely as she ages.  This would  be a reasonable time to substitute a different antihypertensive agent.  Amlodipine is a good choice.  If she does ultimately require treatment with  beta blocker, ACE and pathomimetic agent like pindolol could be tried.   We greatly appreciate this request for consultation and will be happy to  arrange for outpatient stress testing in followup if needed.      Gerrit Friends. Dietrich Pates, MD, [facility]  Electronically Signed     RMR/MEDQ  D:  02/03/2006  T:  02/04/2006  Job:  [id number]

## 2010-10-10 NOTE — Discharge Summary (Signed)
Heather Morrison, Heather Morrison                ACCOUNT NO.:  [id number]   MEDICAL RECORD NO.:  [id number]          PATIENT TYPE:  INP   LOCATION:  4707                         FACILITY:  [facility]   PHYSICIAN:  Bevelyn Buckles. Bensimhon, MDDATE OF BIRTH:  12-17-1916   DATE OF ADMISSION:  08/29/2006  DATE OF DISCHARGE:  08/31/2006                         DISCHARGE SUMMARY - REFERRING   DISCHARGE DIAGNOSES:  1. Acute coronary syndrome.  2. Nonobstructive coronary artery disease with normal left ventricular      function.  3. Hypertension.  4. Peripheral vascular disease.  5. History as stated below.   PROCEDURES PERFORMED:  Cardiac catheterization on 08/30/06 with  selective renal angiogram by Dr. Excell Seltzer, Dr. Gala Romney.   SUMMARY OF HISTORY:  Heather Morrison is an 77 year old white female who  presented with a 1-day history of substernal chest discomfort radiating  to the left arm associated with hypertension.  She was recently  hospitalized for a similar occurrence and an outpatient stress test was  unremarkable.  She has not had any recurring symptoms until this  admission.  On the evening prior to admission, the patient had exercised  on her exercise bike without difficulty.  However, she was awakened at  4:30 a.m. with 5/10 discomfort.  The discomfort lasted all day.  She did  not have any associated symptoms.   PAST MEDICAL HISTORY:  Is notable for:  1. Hypothyroidism.  2. DJD.  3. GERD.  4. Vein stripping.  5. Right hip arthroplasty.  6. Hysterectomy.   LABORATORY:  Chest x-ray on the 6th revealed cardiomegaly without  evidence of pulmonary edema.  Admission weight was 71.5 kg.  The H&H was  11.6 and 34.7, normal indices, platelets 154,000, WBC 6.  The PTT 30, PT  13.7, sodium 142, potassium 4, BUN 26, creatinine 1.3, glucose 75,  normal LFTs.  Prior to discharge, BUN and creatinine were 25 at 1.5 with  a GFR of 33 (prior to admission on 07/06/06, BUN and creatinine were 21  and 1.2 at the  office).  The CK-MBs and relative indexes were negative  x2.  Troponins were 0.02 and 0.07.  Fasting lipids showed a total  cholesterol of 126, triglycerides 90, HDL 42, LDL 66.  The TSH in  February was 2.94.  The EKGs during her admission showed sinus  bradycardia, left axis deviation, left anterior fascicular block,  nonspecific ST-T wave changes, baseline artifact.   HOSPITAL COURSE:  Heather Morrison was admitted to 4700, continued on her home  medications.  She ruled out for myocardial infarction, however, given  her recurring episodes of chest discomfort and hypertension, it was felt  that she should undergo cardiac catheterization.  This was performed on  08/30/06 by Dr. Gala Romney. This revealed a 30% proximal RCA, 30%  proximal circumflex, 40% proximal LAD, 50-60% mid LAD, very small  diagonal 1 of 70-80%, EF by echocardiogram was 55-60%, mild LVH.  During  catheterization,  Dr. Excell Seltzer selectively angiographed her renals and she  had a right renal 70-80% lesion.  Findings were also suspicious for  peripheral vascular disease.  Dopplers were performed and revealed  a  right ABI of 0.6 and a left ABI of 0.72.  Post bed rest and sheath  removal, the patient was ambulating without difficulty.  Catheterization  site was intact.  Dr. Gala Romney did note a chronic, soft bruit at the  right groin.  Dr. Gala Romney, after review, felt that the patient could  be discharged home with outpatient follow-up.   DISPOSITION:  The patient is discharged home.  She will abide by  activities and wound care per the supplemental discharge sheet.  She was  asked to maintain a low-sodium heart-healthy diet.   NEW MEDICATIONS INCLUDE:  1. Norvasc 10 mg daily.  2. Zocor 20 mg q.h.s.  3. Protonix 40 mg daily.  She was asked to continue her:  1. Aspirin 81 mg daily.  2. Benicar HCT 40/12.5 daily.  3. Synthroid 50 mcg daily.   She will have a BMET on 09/07/06 at 11:00 a.m. and follow up with Dr.  Excell Seltzer on  09/15/06 at 11:45.  She was asked to bring all medications and  blood pressure diary to all appointments.  She will need blood work in 6-  8 weeks in regards to LFTs and FLP since Zocor was initiated.  She will  follow up with Dr. Alwyn Ren as needed.  Discharge time 35 minutes.      Joellyn Rued, PA-C      Bevelyn Buckles. Bensimhon, MD  Electronically Signed    EW/MEDQ  D:  08/31/2006  T:  08/31/2006  Job:  [id number]   cc:   Veverly Fells. Excell Seltzer, MD  Dr. Alwyn Ren, primary care physician

## 2010-10-10 NOTE — H&P (Signed)
NAME:  Heather Morrison, VANBERGEN                          ACCOUNT NO.:  [id number]   MEDICAL RECORD NO.:  [id number]                   PATIENT TYPE:  INP   LOCATION:  0482                                 FACILITY:  [facility]   PHYSICIAN:  Ollen Gross, M.D.                 DATE OF BIRTH:  [birth year]   DATE OF ADMISSION:  05/29/2002  DATE OF DISCHARGE:                                HISTORY & PHYSICAL   CHIEF COMPLAINT:  Right hip pain.   HISTORY OF PRESENT ILLNESS:  The patient is an 77 year old female who comes  in to be evaluated by Ollen Gross, M.D. for ongoing right hip pain.  Pain  has been going on for several years and has become progressively worse.  She  states when she walks she feels like her hip is out of joint.  Most of the  pain is laterally down into the groin area which travels down the medial  thigh.  She denies any paraesthesias or lower extremity weakness, but does  have some back and buttock pain.  She is a very active 77 year old who  enjoys housework and gardening and states that the progressive hip pain has  started to interfere with her daily activities.  She is seen in the office  where x-rays demonstrate erosive end-stage arthritis, the right hip with  bone on bone changes throughout.  It is felt she has reached a point where  she could benefit from undergoing total hip replacement arthroplasty.  Risks  and benefits have been discussed and she has elected to proceed with  surgery.   ALLERGIES:  No known drug allergies.   CURRENT MEDICATIONS:  1. Benicar.  2. Hydrochlorothiazide 40/12.5 mg daily.  3. Metoprolol 50 mg one-half tablet daily.  4. Synthroid 50 mcg one-half tablet daily on Monday, Wednesday, Friday, and     Sunday; one and one-half tablet on Tuesday, Thursday, and Saturday.  5. Actonel 30 mg weekly.  6. Celebrex 100 mg daily.  7. Caltrate plus D.  8. Glucosamine.  9. Daily vitamin supplement.   PAST MEDICAL HISTORY:  1. Hypertension.  2. History  of pneumonia.  3. Osteoporosis.  4. Hypothyroidism.   PAST SURGICAL HISTORY:  1. Hysterectomy 1972.  2. Vein surgery left leg 1975.   SOCIAL HISTORY:  Married, retired.  Nonsmoker.  No alcohol.  Has three  children.  Lives in a one-story home with three steps entering into her  house.   FAMILY HISTORY:  Father with a history of hypertension.  Stroke also runs in  the family.   REVIEW OF SYSTEMS:  GENERAL:  No fevers, chills, night sweats.  NEUROLOGIC:  No seizures, syncope, paralysis.  RESPIRATORY:  No shortness of breath,  productive cough, hemoptysis.  CARDIOVASCULAR:  No chest pain, angina,  orthopnea.  GASTROINTESTINAL:  No nausea, vomiting, diarrhea, constipation.  GENITOURINARY:  No dysuria, hematuria, discharge.  MUSCULOSKELETAL:  Pertinent that  of the right hip found in the history of present illness.   PHYSICAL EXAMINATION:  VITAL SIGNS:  Pulse 56, respirations 12, blood  pressure 142/82.  GENERAL:  The patient is an 77 year old white female well-nourished, well-  developed, appears to be in no acute distress.  HEENT:  Normocephalic, atraumatic.  Pupils round and reactive.  Noted to  wear glasses.  EOMs are intact.  NECK:  Supple.  No carotid bruits are appreciated.  CHEST:  Clear to auscultation anterior/posterior chest walls.  No rhonchi,  rales, or wheezing are appreciated.  HEART:  Regular rate and rhythm.  No murmurs.  ABDOMEN:  Soft, nontender.  Slightly round abdomen.  Bowel sounds are  present.  RECTAL:  Not done.  Not pertinent to present illness.  BREASTS:  Not done.  Not pertinent to present illness.  GENITALIA:  Not done.  Not pertinent to present illness.  EXTREMITIES:  Significant that to the right lower extremity.  She does have  hip flexion up to 100 degrees, however, very limited internal rotation at 10  degrees, external rotation at 10 degrees, abduction at 20 degrees.  She does  ambulate with somewhat of an antalgic gait with an abductor lurched  toward  the right.   LABORATORIES:  X-rays taken in the office show severe erosive end-stage  arthritis of the right hip with bone on bone changes throughout.   IMPRESSION:  1. Severe osteoarthritis right hip.  2. Hypertension.  3. History of pneumonia.  4. Osteoporosis.  5. Hypothyroidism.   PLAN:  The patient will be admitted to [facility] to undergo right  total hip replacement arthroplasty.  The patient's medical doctor is Titus Dubin. Alwyn Ren, M.D. [facility].  He will be notified of the room number on admission,  be consulted if needed for any medical assistance with this patient  throughout the hospital course.     Alexzandrew L. Julien Girt, P.A.              Ollen Gross, M.D.    ALP/MEDQ  D:  05/30/2002  T:  05/30/2002  Job:  [id number]   cc:   Ollen Gross, M.D.  [address]  [place]  [place] [address]  Fax: [phone]   Titus Dubin. Alwyn Ren, M.D. [facility]

## 2010-10-10 NOTE — Assessment & Plan Note (Signed)
[facility]                                 ON-CALL NOTE   Heather Morrison, Heather Morrison                       MRN:          [id number]  DATE:08/29/2006                            DOB:          1916-06-10    PRIMARY:  Heather Morrison. Heather Ren, MD.   PHONE NUMBER:  [phone]   SUBJECTIVE:  [name] has a history of chest pain and was admitted for  48 hours within the last year, unclear if there is a diagnosis of past  coronary artery disease.  She calls today having had onset of central  chest pain this morning radiating into her left shoulder.  He has also  had leg weakness.  Since the chest pain this morning, she continues to  have some chest heaviness and blood pressure 165/68.   ASSESSMENT AND PLAN:  Recommended emergency room evaluation.  Encouraged  patient to call emergency medical service, but she felt uncomfortable  with doing this and would prefer to call her son to take her instead.     Heather Nora, MD  Electronically Signed    AB/MedQ  DD: 08/29/2006  DT: 08/30/2006  Job #: [id number]

## 2010-10-15 ENCOUNTER — Ambulatory Visit (INDEPENDENT_AMBULATORY_CARE_PROVIDER_SITE_OTHER): Payer: Medicare Other | Admitting: Internal Medicine

## 2010-10-15 ENCOUNTER — Encounter: Payer: Self-pay | Admitting: Internal Medicine

## 2010-10-15 DIAGNOSIS — J40 Bronchitis, not specified as acute or chronic: Secondary | ICD-10-CM | POA: Insufficient documentation

## 2010-10-15 MED ORDER — AZITHROMYCIN 250 MG PO TABS: ORAL_TABLET | ORAL | Status: AC

## 2010-10-15 NOTE — Assessment & Plan Note (Signed)
77 year old female presents with a one-week history of respiratory symptoms. She's not toxic, she may be developing a ear infection and she has bronchitis on exam. Plan: See instructions

## 2010-10-15 NOTE — Patient Instructions (Addendum)
Rest, fluids , tylenol For cough, take Robitussin DM 4 times  a day as needed for cough Take the antibiotic as prescribed (zithromax) Call if no better in few days Call anytime if the symptoms are severe, you have high fever, short of breath,  chest pain

## 2010-10-15 NOTE — Progress Notes (Signed)
  Subjective:    Patient ID: Verita Lamb, female    DOB: 1916/09/18, 77 y.o.   MRN: [id number]  HPI 77 year old female with multiple medical problems including CAD, hypertension, gout, high cholesterol, hypothyroidism who presented with a one-week history of nose and sinus congestion, pain behind the eyes, cough with production of yellow sputum without blood and  Hoarseness.   PMH-- reviewed    Review of Systems Denies any fevers Mild shortness of breath mostly due to chest congestion and cough. No chest pain, no lower extremity edema. Some fatigue.     Objective:   Physical Exam  Constitutional: She is oriented to person, place, and time. She appears well-developed and well-nourished. No distress.       Nontoxic appearing  HENT:  Head: Normocephalic.       Both tympanic membranes are slightly bulged, left-sided is slightly red. No discharge  Cardiovascular: Normal rate, regular rhythm and normal heart sounds.   Murmur: ? Systolic murmur. Pulmonary/Chest:       No wheezing, no crackles, large airway congestion with cough. No increased work of breathing.  Musculoskeletal: She exhibits no edema.  Neurological: She is alert and oriented to person, place, and time.  Skin: She is not diaphoretic.          Assessment & Plan:

## 2010-10-16 ENCOUNTER — Telehealth: Payer: Self-pay | Admitting: *Deleted

## 2010-10-16 NOTE — Telephone Encounter (Signed)
Pt is aware.  

## 2010-10-16 NOTE — Telephone Encounter (Signed)
Cont same plan x 2 or 3 days . Call if sx worsen or high fever or if not getting better in few days

## 2010-10-16 NOTE — Telephone Encounter (Signed)
I spoke w/ pt she states she is not any better,but not any worse either.

## 2010-10-16 NOTE — Telephone Encounter (Signed)
Message copied by Army Fossa on Thu Oct 16, 2010  8:45 AM ------      Message from: Willow Ora      Created: Wed Oct 15, 2010  5:50 PM       Please check on her, getting better? Worse ?

## 2010-10-23 ENCOUNTER — Other Ambulatory Visit: Payer: Self-pay | Admitting: Internal Medicine

## 2010-10-26 ENCOUNTER — Other Ambulatory Visit: Payer: Self-pay | Admitting: Internal Medicine

## 2010-10-27 NOTE — Telephone Encounter (Signed)
Recheck fasting labs in 10 weeks (lipids , CK, hepatic panel,; 272.4, 995.20).  Labs Due 10/28/10

## 2010-10-31 ENCOUNTER — Other Ambulatory Visit: Payer: Self-pay | Admitting: Internal Medicine

## 2010-10-31 DIAGNOSIS — E785 Hyperlipidemia, unspecified: Secondary | ICD-10-CM

## 2010-10-31 DIAGNOSIS — T887XXA Unspecified adverse effect of drug or medicament, initial encounter: Secondary | ICD-10-CM

## 2010-10-31 DIAGNOSIS — I2510 Atherosclerotic heart disease of native coronary artery without angina pectoris: Secondary | ICD-10-CM

## 2010-11-03 ENCOUNTER — Other Ambulatory Visit (INDEPENDENT_AMBULATORY_CARE_PROVIDER_SITE_OTHER): Payer: Medicare Other

## 2010-11-03 DIAGNOSIS — E785 Hyperlipidemia, unspecified: Secondary | ICD-10-CM

## 2010-11-03 DIAGNOSIS — T887XXA Unspecified adverse effect of drug or medicament, initial encounter: Secondary | ICD-10-CM

## 2010-11-03 DIAGNOSIS — I2510 Atherosclerotic heart disease of native coronary artery without angina pectoris: Secondary | ICD-10-CM

## 2010-11-03 LAB — CK: Total CK: 99 U/L (ref 7–177)

## 2010-11-03 LAB — POTASSIUM: Potassium: 4.1 mEq/L (ref 3.5–5.1)

## 2010-11-03 LAB — HEPATIC FUNCTION PANEL
Bilirubin, Direct: 0.1 mg/dL (ref 0.0–0.3)
Total Bilirubin: 0.6 mg/dL (ref 0.3–1.2)

## 2010-11-03 LAB — LIPID PANEL: Total CHOL/HDL Ratio: 3

## 2010-11-03 LAB — BUN: BUN: 31 mg/dL — ABNORMAL HIGH (ref 6–23)

## 2010-11-03 NOTE — Progress Notes (Signed)
Labs only

## 2010-11-11 ENCOUNTER — Other Ambulatory Visit: Payer: Self-pay | Admitting: Internal Medicine

## 2011-01-20 ENCOUNTER — Other Ambulatory Visit: Payer: Self-pay | Admitting: Internal Medicine

## 2011-01-20 NOTE — Telephone Encounter (Signed)
TSH 244.9 

## 2011-01-24 ENCOUNTER — Other Ambulatory Visit: Payer: Self-pay | Admitting: Internal Medicine

## 2011-03-30 ENCOUNTER — Other Ambulatory Visit: Payer: Self-pay | Admitting: Internal Medicine

## 2011-04-24 ENCOUNTER — Other Ambulatory Visit: Payer: Self-pay | Admitting: Internal Medicine

## 2011-05-04 ENCOUNTER — Ambulatory Visit (INDEPENDENT_AMBULATORY_CARE_PROVIDER_SITE_OTHER): Payer: Medicare Other | Admitting: Cardiovascular Disease

## 2011-05-04 ENCOUNTER — Encounter: Payer: Self-pay | Admitting: Cardiovascular Disease

## 2011-05-04 VITALS — BP 148/66 | HR 52 | Ht 63.5 in | Wt 149.8 lb

## 2011-05-04 DIAGNOSIS — I739 Peripheral vascular disease, unspecified: Secondary | ICD-10-CM

## 2011-05-04 DIAGNOSIS — I701 Atherosclerosis of renal artery: Secondary | ICD-10-CM

## 2011-05-04 DIAGNOSIS — I2510 Atherosclerotic heart disease of native coronary artery without angina pectoris: Secondary | ICD-10-CM

## 2011-05-04 NOTE — Assessment & Plan Note (Signed)
The patient's cardiac catheter report was reviewed from 2008. She had mild to moderate nonobstructive CAD. She is having no anginal symptoms. She will continue her current medical program.

## 2011-05-04 NOTE — Assessment & Plan Note (Signed)
Stable renal function. Blood pressure reasonably well controlled especially considering her advanced age. Continue observation.

## 2011-05-04 NOTE — Progress Notes (Signed)
HPI:  This is a 77 year old woman presenting for followup of lower extremity peripheral arterial disease and renal artery stenosis.  The patient was diagnosed with unilateral renal artery stenosis in 2008 when she was undergoing cardiac catheterization. Selective renal angiography demonstrated a 70-80% stenosis of the right renal artery. The patient has been managed conservatively and she has done well. She has mild renal insufficiency and has been followed regularly by Dr Arrie Aran at [facility]. The last creatinine in my review was 1.5 mg per deciliter earlier this year. The patient reports that she was seen recently and that her creatinine level is stable. Her blood pressure has been well controlled at home. She remains remarkably active. She's been raking a lot of leaves this fall has had no exertional symptoms. She does note bilateral calf claudication symptoms when she walks to her mailbox and back. This is stable over several years. She denies chest pain, dyspnea, or leg swelling.  Outpatient Encounter Prescriptions as of 05/04/2011  Medication Sig Dispense Refill  . acetaminophen (TYLENOL) 500 MG tablet Take 500 mg by mouth every 6 (six) hours as needed.        Marland Kitchen alendronate (FOSAMAX) 70 MG tablet TAKE 1 TABLET EVERY WEEK  12 tablet  3  . amLODipine (NORVASC) 10 MG tablet TAKE 1 TABLET DAILY  90 tablet  1  . Calcium-Vitamin D (CALTRATE 600 PLUS-VIT D PO) Take by mouth 2 (two) times daily.        . CRESTOR 20 MG tablet TAKE ONE-HALF (1/2) TABLET AT BEDTIME (LABS DUE)  45 tablet  1  . furosemide (LASIX) 40 MG tablet Take 40 mg by mouth daily.        Marland Kitchen levothyroxine (SYNTHROID, LEVOTHROID) 50 MCG tablet TAKE 1 TABLET DAILY EXCEPT ONE AND ONE-HALF TABLETS ON TUESDAY, THURSDAY, AND SATURDAY (DUE FOR LABWORK)  108 tablet  0  . losartan (COZAAR) 100 MG tablet TAKE 1 TABLET ONCE A DAY  90 tablet  1  . Multiple Vitamin (MULTIVITAMIN) capsule Take 1 capsule by mouth daily.          No Known  Allergies  Past Medical History  Diagnosis Date  . Coronary atherosclerosis of native coronary artery   . Atherosclerosis of renal artery   . Essential hypertension, benign   . Pure hypercholesterolemia     ROS: Negative except as per HPI  BP 148/66  Pulse 52  Ht 5' 3.5" (1.613 m)  Wt 67.949 kg (149 lb 12.8 oz)  BMI 26.12 kg/m2  PHYSICAL EXAM: Pt is alert and oriented, very pleasant elderly woman in NAD HEENT: normal Neck: JVP - normal, carotids 2+= without bruits Lungs: CTA bilaterally CV: RRR without murmur or gallop Abd: soft, NT, Positive BS, no hepatomegaly Ext: no C/C/E, pedal pulses nonpalpable. Skin: warm/dry no rash  EKG:  Sinus bradycardia 52 beats per minute, otherwise within normal limits.  ASSESSMENT AND PLAN:

## 2011-05-04 NOTE — Patient Instructions (Signed)
Your physician wants you to follow-up in: 1 YEAR.  You will receive a reminder letter in the mail two months in advance. If you don't receive a letter, please call our office to schedule the follow-up appointment.  Your physician recommends that you continue on your current medications as directed. Please refer to the Current Medication list given to you today.  

## 2011-05-04 NOTE — Assessment & Plan Note (Signed)
Stable symptoms of claudication, mildly lifestyle limiting. Continue observation.

## 2011-06-15 ENCOUNTER — Other Ambulatory Visit: Payer: Self-pay | Admitting: Internal Medicine

## 2011-06-15 MED ORDER — LEVOTHYROXINE SODIUM 50 MCG PO TABS: ORAL_TABLET | ORAL | Status: DC

## 2011-06-15 NOTE — Telephone Encounter (Signed)
RX manually sent

## 2011-07-10 ENCOUNTER — Telehealth: Payer: Self-pay | Admitting: Internal Medicine

## 2011-07-10 MED ORDER — FUROSEMIDE 40 MG PO TABS: 40.0000 mg | ORAL_TABLET | Freq: Every day | ORAL | Status: DC

## 2011-07-10 NOTE — Telephone Encounter (Signed)
Refill- furosemide 40mg  tab. 90 day supply

## 2011-07-10 NOTE — Telephone Encounter (Signed)
Refilled as requested  

## 2011-08-06 ENCOUNTER — Other Ambulatory Visit (INDEPENDENT_AMBULATORY_CARE_PROVIDER_SITE_OTHER): Payer: Medicare Other

## 2011-08-06 ENCOUNTER — Telehealth: Payer: Self-pay | Admitting: Internal Medicine

## 2011-08-06 DIAGNOSIS — E039 Hypothyroidism, unspecified: Secondary | ICD-10-CM

## 2011-08-06 NOTE — Telephone Encounter (Signed)
Patient coming in today at 2pm thanks

## 2011-08-06 NOTE — Telephone Encounter (Signed)
Refill for  Levothyroxine Sodium (Tab)  50 MCG   Last fill date 1.21.13  No qty listed

## 2011-08-06 NOTE — Telephone Encounter (Signed)
Please contact patient for lab appointment(TSH 244.9), last TSH check was greater than 1 year ago. Once appointment is scheduled med can be filled

## 2011-08-13 ENCOUNTER — Other Ambulatory Visit: Payer: Self-pay | Admitting: Internal Medicine

## 2011-08-13 MED ORDER — LEVOTHYROXINE SODIUM 50 MCG PO TABS: ORAL_TABLET | ORAL | Status: DC

## 2011-08-13 NOTE — Telephone Encounter (Signed)
Refill for Levothyroxin tab No qty listed last give 108 TAKE 1 TABLET DAILY EXCEPT ONE AND ONE-HALF TABLETS ON TUESDAY, THURSDAY, AND SATURDAY  Last filled 1.21.13

## 2011-08-13 NOTE — Telephone Encounter (Signed)
RX sent

## 2011-09-01 ENCOUNTER — Other Ambulatory Visit: Payer: Self-pay | Admitting: Internal Medicine

## 2011-09-01 MED ORDER — LOSARTAN POTASSIUM 100 MG PO TABS: ORAL_TABLET | ORAL | Status: DC

## 2011-09-01 NOTE — Telephone Encounter (Signed)
Refill for  Losartan POT Tab 100mg  Qty 90  Last written 11.5.12 Last instructions TAKE 1 TABLET ONCE A DAY

## 2011-09-01 NOTE — Telephone Encounter (Signed)
rx sent, patient will need a CPX prior to next refill

## 2011-09-16 ENCOUNTER — Telehealth: Payer: Self-pay | Admitting: Internal Medicine

## 2011-09-16 NOTE — Telephone Encounter (Signed)
Opened in error. BC °

## 2011-09-17 ENCOUNTER — Ambulatory Visit (INDEPENDENT_AMBULATORY_CARE_PROVIDER_SITE_OTHER): Payer: Medicare Other | Admitting: Internal Medicine

## 2011-09-17 ENCOUNTER — Telehealth: Payer: Self-pay | Admitting: *Deleted

## 2011-09-17 ENCOUNTER — Encounter: Payer: Self-pay | Admitting: Internal Medicine

## 2011-09-17 VITALS — BP 112/60 | HR 60 | Temp 97.6°F | Ht 63.0 in | Wt 150.0 lb

## 2011-09-17 DIAGNOSIS — E039 Hypothyroidism, unspecified: Secondary | ICD-10-CM

## 2011-09-17 DIAGNOSIS — G2581 Restless legs syndrome: Secondary | ICD-10-CM

## 2011-09-17 DIAGNOSIS — K219 Gastro-esophageal reflux disease without esophagitis: Secondary | ICD-10-CM

## 2011-09-17 DIAGNOSIS — I10 Essential (primary) hypertension: Secondary | ICD-10-CM

## 2011-09-17 DIAGNOSIS — I2510 Atherosclerotic heart disease of native coronary artery without angina pectoris: Secondary | ICD-10-CM

## 2011-09-17 DIAGNOSIS — Z0000 Encounter for general adult medical examination without abnormal findings: Secondary | ICD-10-CM

## 2011-09-17 DIAGNOSIS — M899 Disorder of bone, unspecified: Secondary | ICD-10-CM

## 2011-09-17 DIAGNOSIS — E785 Hyperlipidemia, unspecified: Secondary | ICD-10-CM

## 2011-09-17 DIAGNOSIS — M949 Disorder of cartilage, unspecified: Secondary | ICD-10-CM

## 2011-09-17 LAB — LIPID PANEL
Cholesterol: 130 mg/dL (ref 0–200)
LDL Cholesterol: 51 mg/dL (ref 0–99)
VLDL: 18.4 mg/dL (ref 0.0–40.0)

## 2011-09-17 LAB — CBC WITH DIFFERENTIAL/PLATELET
Basophils Relative: 0.3 % (ref 0.0–3.0)
Eosinophils Absolute: 0.1 10*3/uL (ref 0.0–0.7)
Eosinophils Relative: 1.4 % (ref 0.0–5.0)
HCT: 37.5 % (ref 36.0–46.0)
Lymphs Abs: 1.9 10*3/uL (ref 0.7–4.0)
MCHC: 33 g/dL (ref 30.0–36.0)
MCV: 90.3 fl (ref 78.0–100.0)
Monocytes Absolute: 0.5 10*3/uL (ref 0.1–1.0)
Platelets: 145 10*3/uL — ABNORMAL LOW (ref 150.0–400.0)
RBC: 4.16 Mil/uL (ref 3.87–5.11)
WBC: 6.7 10*3/uL (ref 4.5–10.5)

## 2011-09-17 LAB — HEPATIC FUNCTION PANEL
ALT: 17 U/L (ref 0–35)
Albumin: 4 g/dL (ref 3.5–5.2)
Total Protein: 6.8 g/dL (ref 6.0–8.3)

## 2011-09-17 LAB — BASIC METABOLIC PANEL
BUN: 36 mg/dL — ABNORMAL HIGH (ref 6–23)
CO2: 27 mEq/L (ref 19–32)
Chloride: 106 mEq/L (ref 96–112)
Creatinine, Ser: 1.6 mg/dL — ABNORMAL HIGH (ref 0.4–1.2)
Potassium: 4.1 mEq/L (ref 3.5–5.1)

## 2011-09-17 MED ORDER — TRAMADOL HCL 50 MG PO TABS: ORAL_TABLET | ORAL | Status: DC

## 2011-09-17 NOTE — Telephone Encounter (Signed)
Pt called to report that she spoke with pharmacy and she started fosamax in 2009.

## 2011-09-17 NOTE — Progress Notes (Signed)
Subjective:    [name], female    DOB: 06-Aug-1916, 78 y.o.   MRN: [id number]  HPI Medicare Wellness Visit:  The following psychosocial & medical history were reviewed as required by Medicare.  Social history: caffeine: one cup a day in the morning , alcohol: Pt denies alcohol intake , tobacco use : pt denies tobacco use .Exercise : pt exercises everyday with full body stretching twice per day for 15-20 min each time; pt also gardens.  Home & personal safety / fall risk: occassionally due to eye sight when pt forgets glasses, activities of daily living: pt is independant , seatbelt use : pt always uses seatbelt , and smoke alarm employment : pt has smoke alarms in home . Power of Attorney/Living Will status : in place; power attorney is now her daughter Vision ( as recorded per Nurse) & Hearing evaluation : Pt with gross hearing impairment from five feet to whisper. Pt with gross vision intact from five feet with glasses on. Orientation :Alert and oriented X 3 , memory & recall :Pt recalls all three words , spelling  testing: pt appropriately spells the word, world, backwards, and mood & affect : pt is cooperative and interested in care . Depression / anxiety: pt denies Travel history : pt traveled about 40 years ago to [place] , immunization status :pt is up to date on immunizations including the influenza and pneumococcal vaccinations , transfusion history: Pt denies, and preventive health surveillance ( colonoscopies, BMD , etc as per protocol/ [facility]): pt denies having a colonoscopy, pt has had a mammogram performed approximately 8 years ago, Dental care: Pt sees the dentist about 2X per year . Chart reviewed & Updated. Active issues reviewed & addressed.   Review of Systems HYPERTENSION: Disease Monitoring: Pt monitors BP at home Blood pressure range: Pt did not bring BP log to visit today and does not remember BP ranges at home. Pt states she does not check her BP daily  especially when she is feeling good. Pt asked to bring BP log to next visit. Chest pain, palpitations- Pt denies Dyspnea - Pt states she can become short of breath after talking for a while with a lot of company however she does not get short of breath with activity or working in the garden. Medications: Compliance- Pt states she takes her medications everyday as directed. Lightheadedness: Pt denies, Syncope- Pt denies Edema- Pt does have occasional edema in ankles and legs. Pt states she has a problem with her veins. Pt notices it after being out in the garden at the end of the day.   HYPERLIPIDEMIA: See above Abd pain, bowel changes- pt denies   ROS Polyuria/phagia/dipsia- pt gets up one time through the night to urinate. Pt denies polyuria/phagia/dipsiaVisual problems- pt states she needs to go see the opthmologist to get far distance glasses  Pt states she gets some leg cramps; occurs sometimes in both legs or just one where the cramping begins at the foot and radiates up the leg. This has been occurring for about one year but pt noticed that when she is on her feet a lot throughtout the day, the cramps occur more frequently. Pt uses support hose that aren't as tight as "regular support hose" because pt can't put the "tight support hose on" due to her hip replacement and these seem to help some.She denies claudication      Objective:   Physical Exam Gen.: Healthy and well-nourished in appearance. Alert, appropriate and cooperative  throughout exam.Appears younger than stated age  Head: Normocephalic without obvious abnormalities Eyes: No corneal or conjunctival inflammation noted. Arcus senilis. Extraocular motion intact. Vision grossly normal with lenses. Ears: External  ear exam reveals no significant lesions or deformities. Canals clear .TMs normal. Hearing is grossly decreased  bilaterally. Nose: External nasal exam reveals no deformity or inflammation. Nasal mucosa are pink and  moist. No lesions or exudates noted.  Mouth: Oral mucosa and oropharynx reveal no lesions or exudates. Teeth in good repair. Neck: No deformities, masses, or tenderness noted. Range of motion decreased. Thyroid small.Torticollis to L Lungs: Normal respiratory effort; chest expands symmetrically. Lungs are clear to auscultation without rales, wheezes, or increased work of breathing. Heart: Normal rate and rhythm. Normal S1 and S2. No gallop, click, or rub. S4 with slurring . Abdomen: Bowel sounds normal; abdomen soft and nontender. No masses, organomegaly or hernias noted. Genitalia: deferred Musculoskeletal/extremities: Lordosis & scoliosis noted of  the thoracic  spine. No clubbing, cyanosis, edema noted. Range of motion  normal .Tone & strength  Normal. OA joint changes. Nail health  good. Vascular: Carotid, radial artery pulses are full and equal. No bruits present. Decreased pedal pulses Neurologic: Alert and oriented x3. Deep tendon reflexes symmetrical and normal.          Skin: Intact without suspicious lesions or rashes. Lymph: No cervical, axillary  lymphadenopathy present. Psych: Mood and affect are normal. Normally interactive                                                                                         Assessment & Plan:  #1 Medicare Wellness Exam; criteria met ; data entered #2 Problem List reviewed ; Assessment/ Recommendations made. #3 leg cramps; trial of low-dose tramadol recommended @ bedtime with isometrics & MagCal supplement Plan: see Orders

## 2011-09-18 LAB — VITAMIN D 25 HYDROXY (VIT D DEFICIENCY, FRACTURES): Vit D, 25-Hydroxy: 51 ng/mL (ref 30–89)

## 2011-09-19 ENCOUNTER — Encounter: Payer: Self-pay | Admitting: Internal Medicine

## 2011-09-20 ENCOUNTER — Encounter: Payer: Self-pay | Admitting: Internal Medicine

## 2011-09-20 NOTE — Patient Instructions (Addendum)
Repeat the isometric exercises discussed 4- 5 times prior to going to bed & before  standing if you've been seated for a period of time. The supplement Cal/Mag may help cramps Continue present  vitamin D3 dose ; vit D deficiency is #1 cause of muscle pain in women.  Exercise  30  minutes a day, 3-4  days a week. Walking is especially valuable in preventing Osteoporosis. Eat a low-fat diet with lots of fruits and vegetables, up to 7-9 servings per day.

## 2011-09-21 ENCOUNTER — Telehealth: Payer: Self-pay | Admitting: *Deleted

## 2011-09-21 NOTE — Telephone Encounter (Signed)
Pt requesting samples of crestor until mail order arrives. .Please advise

## 2011-09-21 NOTE — Telephone Encounter (Signed)
Pt aware samples placed up front for pick up . 

## 2011-09-21 NOTE — Telephone Encounter (Signed)
OK # 28

## 2011-09-29 ENCOUNTER — Encounter: Payer: Self-pay | Admitting: Cardiology

## 2011-10-06 ENCOUNTER — Other Ambulatory Visit (INDEPENDENT_AMBULATORY_CARE_PROVIDER_SITE_OTHER): Payer: Medicare Other

## 2011-10-06 DIAGNOSIS — Z1289 Encounter for screening for malignant neoplasm of other sites: Secondary | ICD-10-CM

## 2011-10-06 LAB — POC HEMOCCULT BLD/STL (OFFICE/1-CARD/DIAGNOSTIC): Fecal Occult Blood, POC: NEGATIVE

## 2011-10-24 ENCOUNTER — Other Ambulatory Visit: Payer: Self-pay | Admitting: *Deleted

## 2011-10-24 MED ORDER — AMLODIPINE BESYLATE 10 MG PO TABS: 10.0000 mg | ORAL_TABLET | Freq: Every day | ORAL | Status: DC

## 2011-10-28 ENCOUNTER — Other Ambulatory Visit: Payer: Self-pay | Admitting: Internal Medicine

## 2011-10-28 DIAGNOSIS — M8580 Other specified disorders of bone density and structure, unspecified site: Secondary | ICD-10-CM

## 2011-10-28 NOTE — Telephone Encounter (Signed)
refill alendronate tab 70mg  tablet Requesting 90-days Take one tablet every week  Last wrt 6.19.12 for qty 12 Last ov 4.25.13

## 2011-10-29 NOTE — Telephone Encounter (Signed)
Patient checked her home records and she starting taking Fosamax in 2003. Patient will stop fosamax after her current supply is complete and have BMD and then f/u with Dr.Hopper

## 2011-11-02 ENCOUNTER — Other Ambulatory Visit: Payer: Self-pay | Admitting: Internal Medicine

## 2011-11-02 NOTE — Telephone Encounter (Signed)
   Taken care of in a previous note

## 2011-11-18 ENCOUNTER — Telehealth: Payer: Self-pay | Admitting: Internal Medicine

## 2011-11-18 ENCOUNTER — Other Ambulatory Visit: Payer: Self-pay | Admitting: Internal Medicine

## 2011-11-18 DIAGNOSIS — M8580 Other specified disorders of bone density and structure, unspecified site: Secondary | ICD-10-CM

## 2011-11-18 NOTE — Telephone Encounter (Signed)
Dr.Hopper please advise on order for BMD

## 2011-11-18 NOTE — Telephone Encounter (Signed)
Ordered 10/29/11; have Bradly Chris check

## 2011-11-18 NOTE — Telephone Encounter (Signed)
Pt states she is supposed to have a bone density done and she spoke with someone 2 weeks ago, but has not heard anything else. Pt would like an update.

## 2011-11-24 NOTE — Telephone Encounter (Signed)
Patient scheduled she is aware .

## 2011-11-24 NOTE — Telephone Encounter (Signed)
I left message for patient to return my call so I can schedule her dexa.

## 2011-12-01 ENCOUNTER — Ambulatory Visit (INDEPENDENT_AMBULATORY_CARE_PROVIDER_SITE_OTHER)
Admission: RE | Admit: 2011-12-01 | Discharge: 2011-12-01 | Disposition: A | Discharge status: Home / Self Care | Payer: Medicare Other | Source: Ambulatory Visit | Attending: Internal Medicine | Admitting: Internal Medicine

## 2011-12-01 DIAGNOSIS — M8580 Other specified disorders of bone density and structure, unspecified site: Secondary | ICD-10-CM

## 2011-12-01 DIAGNOSIS — M949 Disorder of cartilage, unspecified: Secondary | ICD-10-CM

## 2011-12-22 ENCOUNTER — Telehealth: Payer: Self-pay

## 2011-12-22 NOTE — Telephone Encounter (Signed)
I called patient several time, line busy. I will mail patient a letter informing her to schedule appointment to discuss BMD

## 2011-12-22 NOTE — Telephone Encounter (Signed)
Message copied by Maurice Small on Tue Dec 22, 2011 11:11 AM ------      Message from: Pecola Lawless      Created: Sat Dec 19, 2011  5:13 PM       Please schedule followup appointment to discuss the recommendations as per Dr Jonny Ruiz who read the bone density. Please bring all actual pill bottles and supplements.

## 2012-01-01 ENCOUNTER — Ambulatory Visit (INDEPENDENT_AMBULATORY_CARE_PROVIDER_SITE_OTHER): Payer: Medicare Other | Admitting: Internal Medicine

## 2012-01-01 ENCOUNTER — Encounter: Payer: Self-pay | Admitting: Internal Medicine

## 2012-01-01 VITALS — BP 120/68 | HR 50 | Wt 144.0 lb

## 2012-01-01 DIAGNOSIS — IMO0002 Reserved for concepts with insufficient information to code with codable children: Secondary | ICD-10-CM

## 2012-01-01 DIAGNOSIS — N289 Disorder of kidney and ureter, unspecified: Secondary | ICD-10-CM

## 2012-01-01 DIAGNOSIS — M169 Osteoarthritis of hip, unspecified: Secondary | ICD-10-CM

## 2012-01-01 DIAGNOSIS — H9190 Unspecified hearing loss, unspecified ear: Secondary | ICD-10-CM

## 2012-01-01 DIAGNOSIS — M5416 Radiculopathy, lumbar region: Secondary | ICD-10-CM

## 2012-01-01 DIAGNOSIS — H9193 Unspecified hearing loss, bilateral: Secondary | ICD-10-CM

## 2012-01-01 DIAGNOSIS — R1032 Left lower quadrant pain: Secondary | ICD-10-CM

## 2012-01-01 LAB — CBC WITH DIFFERENTIAL/PLATELET
Basophils Absolute: 0 10*3/uL (ref 0.0–0.1)
Eosinophils Relative: 0.9 % (ref 0.0–5.0)
HCT: 38 % (ref 36.0–46.0)
Lymphocytes Relative: 28.3 % (ref 12.0–46.0)
Lymphs Abs: 2.1 10*3/uL (ref 0.7–4.0)
Monocytes Relative: 6.4 % (ref 3.0–12.0)
Platelets: 153 10*3/uL (ref 150.0–400.0)
RDW: 13.8 % (ref 11.5–14.6)
WBC: 7.5 10*3/uL (ref 4.5–10.5)

## 2012-01-01 LAB — BASIC METABOLIC PANEL
BUN: 33 mg/dL — ABNORMAL HIGH (ref 6–23)
CO2: 30 mEq/L (ref 19–32)
GFR: 29.62 mL/min — ABNORMAL LOW (ref >60.00)
Glucose, Bld: 72 mg/dL (ref 70–99)
Potassium: 4.2 mEq/L (ref 3.5–5.1)

## 2012-01-01 LAB — POCT URINALYSIS DIPSTICK
Bilirubin, UA: NEGATIVE
Blood, UA: NEGATIVE
Nitrite, UA: NEGATIVE
Spec Grav, UA: 1.01
pH, UA: 6

## 2012-01-01 NOTE — Patient Instructions (Addendum)
Please complete stool cards Please try to go on My Chart within the next 24 hours to allow me to release the results directly to you.  

## 2012-01-01 NOTE — Progress Notes (Signed)
Subjective:    [name], female    DOB: 06-Jan-1917, 78 y.o.   MRN: [id number]  HPI She describes constant pain in the left hip; this prevents her from gardening and squatting. She is status post total right hip replacement by Dr Despina Hick with good response. Tylenol at bedtime has been of some benefit. She is not taking any nonsteroidals. She also has pain in the left lower extremity along the lateral calf; this is affected positionally. Chronically she's had localized numbness of  the third and fourth toes bilaterally.  She describes a cramping discomfort in the left lower quadrant/ inguinal area over the past month following bowel movements. This has not been associated with change in her bowels although she did have a self-limited constipation last month. This discomfort is not associated with back discomfort.    Review of Systems She denies melena, rectal bleeding, or unexplained weight loss. She's never had a colonoscopy. There is no family history of ulcers, colon polyp, colitis, or colon cancer. She did have hysterectomy and a single ovary removed for fibroids. She denies dysuria, pyuria, or hematuria.  She has chronic hearing loss, she has not seen an  Audiologist to date.     Objective:   Physical Exam  Gen.:  well-nourished in appearance. Alert, appropriate and cooperative throughout exam.Appears younger than stated age   Eyes: No corneal or conjunctival inflammation noted. Arcus. No icterus. Ears: External  ear exam reveals no significant lesions or deformities. Canals clear .TMs normal. Hearing is grossly decreased bilaterally. Nose: External nasal exam reveals no deformity or inflammation. Nasal mucosa are pink and moist. No lesions or exudates noted.   Mouth: Oral mucosa and oropharynx reveal no lesions or exudates. Teeth in good repair. Neck: No deformities, masses, or tenderness noted. Torticollis. Lungs: Normal respiratory effort; chest expands symmetrically.  Lungs are clear to auscultation without rales, wheezes, or increased work of breathing. Heart: Normal rate and rhythm. Normal S1 and S2. No gallop, click, or rub. S4 w/o murmur. Abdomen: Bowel sounds normal; abdomen soft and nontender. No masses, organomegaly or hernias noted. Initially she jumped when the lower quadrants were palpated; but she stated this did not cause pain it was simply an anticipatory reaction                                                                                  Musculoskeletal/extremities: Asymmetry of the upper thoracic spine with the right paraspinous muscular greater than the left. Significant events next arthritic changes in hands. Limping related to the left hip. Pain with range of motion of the left hip  Vascular: Carotid, radial artery, dorsalis pedis and  posterior tibial pulses are full and equal. No bruits present. Neurologic: Alert and oriented x3. Deep tendon reflexes symmetrical and normal. Head tremor         Skin: Intact without suspicious lesions or rashes. Lymph: No cervical, axillary, or L  inguinal lymphadenopathy present. Psych: Mood and affect are normal. Normally interactive  Assessment & Plan:  #1 advanced degenerative joint disease left hip; Orthopedic referral appropriate  #2 left lower quadrant/inguinal area pain. The etiology is unclear and gastrointestinal, ovarian, or radicular processes must be considered  #3 hearing loss, bilateral. Audiology evaluation appropriate  #4 paresthesias of the feet most likely related to digital nerve compression. This is unlikely to be related to any spinal issues  #5 left lower extremity pain possibly representing L5 radiculopathy.  Plan: See orders and recommendations

## 2012-01-04 ENCOUNTER — Other Ambulatory Visit: Payer: Self-pay | Admitting: Internal Medicine

## 2012-01-04 NOTE — Telephone Encounter (Signed)
FUROSEMIDE TABLET 40MG  90 DAYS  TAKE 1 BY MOUTH DAILY  CRESTOR TABLET 20 MG 90 DAY TAKE ONE-HALF (1/2) TABLET AT BEDTIME (LABS DUE)  LOSARTAN POTASSIUM TABLET 100MG  90 DAY  TAKE 1 BY MOUTH DAILY

## 2012-01-05 MED ORDER — FUROSEMIDE 40 MG PO TABS: 40.0000 mg | ORAL_TABLET | Freq: Every day | ORAL | Status: DC

## 2012-01-05 MED ORDER — LOSARTAN POTASSIUM 100 MG PO TABS: ORAL_TABLET | ORAL | Status: DC

## 2012-01-05 MED ORDER — ROSUVASTATIN CALCIUM 20 MG PO TABS: ORAL_TABLET | ORAL | Status: DC

## 2012-01-05 NOTE — Telephone Encounter (Signed)
RXs sent.

## 2012-01-11 ENCOUNTER — Other Ambulatory Visit (INDEPENDENT_AMBULATORY_CARE_PROVIDER_SITE_OTHER): Payer: Medicare Other

## 2012-01-11 DIAGNOSIS — Z1289 Encounter for screening for malignant neoplasm of other sites: Secondary | ICD-10-CM

## 2012-02-12 ENCOUNTER — Other Ambulatory Visit: Payer: Self-pay | Admitting: Internal Medicine

## 2012-02-12 DIAGNOSIS — E039 Hypothyroidism, unspecified: Secondary | ICD-10-CM

## 2012-02-12 NOTE — Telephone Encounter (Signed)
Spoke with patient, patient aware she is due for labs this month. Scheduled for 02/19/12 (next Friday), patient aware we will fill med once we receive results, patient stated she has enough pills to last her

## 2012-02-12 NOTE — Telephone Encounter (Signed)
refill levothyroxine tab 90-day supply TAKE 1 TABLET DAILY EXCEPT ONE AND ONE-HALF TABLETS ON TUESDAY, THURSDAY, AND SATURDAY --- last wrt 3.21.13 #108 wt/1-refill  Last ov WT/LABS 8.9.13 f/u BMD

## 2012-02-19 ENCOUNTER — Other Ambulatory Visit: Payer: Self-pay | Admitting: Internal Medicine

## 2012-02-19 ENCOUNTER — Other Ambulatory Visit (INDEPENDENT_AMBULATORY_CARE_PROVIDER_SITE_OTHER): Payer: Medicare Other

## 2012-02-19 DIAGNOSIS — E039 Hypothyroidism, unspecified: Secondary | ICD-10-CM

## 2012-02-19 DIAGNOSIS — M25559 Pain in unspecified hip: Secondary | ICD-10-CM

## 2012-02-19 MED ORDER — TRAMADOL HCL 50 MG PO TABS: ORAL_TABLET | ORAL | Status: DC

## 2012-02-22 ENCOUNTER — Other Ambulatory Visit: Payer: Self-pay | Admitting: Internal Medicine

## 2012-02-22 MED ORDER — LEVOTHYROXINE SODIUM 50 MCG PO TABS: ORAL_TABLET | ORAL | Status: DC

## 2012-02-22 NOTE — Telephone Encounter (Signed)
refill levothyroxin tab 50 MCG tablet -requesting 90-day supply take 1 by mouth daily except take 1 & 1/2 by mouth tuesday, thrusday and saturday (DUE FOR LAB WORK) Last ov & labs done 8.9.13 follow up

## 2012-02-22 NOTE — Telephone Encounter (Signed)
RX sent

## 2012-04-08 ENCOUNTER — Other Ambulatory Visit: Payer: Self-pay | Admitting: Internal Medicine

## 2012-04-13 ENCOUNTER — Other Ambulatory Visit: Payer: Self-pay | Admitting: Internal Medicine

## 2012-04-14 ENCOUNTER — Other Ambulatory Visit: Payer: Self-pay | Admitting: Internal Medicine

## 2012-05-27 ENCOUNTER — Other Ambulatory Visit: Payer: Self-pay | Admitting: Internal Medicine

## 2012-05-27 NOTE — Telephone Encounter (Signed)
Spoke with pt, pt aware to stop by the office to sign controlled substance contract and pick-up rx

## 2012-06-17 ENCOUNTER — Encounter: Payer: Self-pay | Admitting: Internal Medicine

## 2012-06-24 ENCOUNTER — Other Ambulatory Visit: Payer: Self-pay | Admitting: Internal Medicine

## 2012-06-28 ENCOUNTER — Other Ambulatory Visit: Payer: Self-pay | Admitting: Internal Medicine

## 2012-06-29 NOTE — Telephone Encounter (Signed)
OK # 30 , R x 2

## 2012-06-29 NOTE — Telephone Encounter (Signed)
Last filled on 06/24/12, please advise if ok to give additional refills

## 2012-07-09 ENCOUNTER — Other Ambulatory Visit: Payer: Self-pay

## 2012-07-22 ENCOUNTER — Telehealth: Payer: Self-pay | Admitting: Internal Medicine

## 2012-07-22 DIAGNOSIS — T887XXA Unspecified adverse effect of drug or medicament, initial encounter: Secondary | ICD-10-CM

## 2012-07-22 DIAGNOSIS — E785 Hyperlipidemia, unspecified: Secondary | ICD-10-CM

## 2012-07-22 MED ORDER — ROSUVASTATIN CALCIUM 20 MG PO TABS: ORAL_TABLET | ORAL | Status: DC

## 2012-07-22 NOTE — Telephone Encounter (Signed)
refill  CRESTOR 20 MG TAKE ONE-HALF (1/2) TABLET AT BEDTIME --requesting a 90-day supply NO last fill date last wrt 8.13.13

## 2012-07-22 NOTE — Telephone Encounter (Signed)
RX sent electronically, Side Note: Labs due 08/2012, future orders placed

## 2012-07-27 ENCOUNTER — Encounter: Payer: Self-pay | Admitting: Internal Medicine

## 2012-07-27 ENCOUNTER — Ambulatory Visit (INDEPENDENT_AMBULATORY_CARE_PROVIDER_SITE_OTHER): Payer: Medicare Other | Admitting: Internal Medicine

## 2012-07-27 VITALS — BP 130/78 | HR 60 | Temp 97.4°F | Resp 12 | Ht 63.0 in | Wt 143.0 lb

## 2012-07-27 DIAGNOSIS — Z23 Encounter for immunization: Secondary | ICD-10-CM

## 2012-07-27 DIAGNOSIS — I10 Essential (primary) hypertension: Secondary | ICD-10-CM

## 2012-07-27 DIAGNOSIS — M899 Disorder of bone, unspecified: Secondary | ICD-10-CM

## 2012-07-27 DIAGNOSIS — E785 Hyperlipidemia, unspecified: Secondary | ICD-10-CM

## 2012-07-27 DIAGNOSIS — E039 Hypothyroidism, unspecified: Secondary | ICD-10-CM

## 2012-07-27 DIAGNOSIS — Z111 Encounter for screening for respiratory tuberculosis: Secondary | ICD-10-CM

## 2012-07-27 DIAGNOSIS — I2510 Atherosclerotic heart disease of native coronary artery without angina pectoris: Secondary | ICD-10-CM

## 2012-07-27 NOTE — Patient Instructions (Addendum)
Review and correct the record as indicated. Please share record with all medical staff seen.  

## 2012-07-27 NOTE — Progress Notes (Signed)
  Subjective:    [name], female    DOB: November 22, 1916, 79 y.o.   MRN: [id number]  HPI She is on no specific diet; she does not exercise because of hip requiring use of a cane for support.  Specifically she denies chest pain, palpitations, dyspnea, or claudication.   There is medication compliance with the statin. Significant abdominal symptoms, memory deficit, or myalgias denied.  Past medical history/family history/social history were all reviewed and updated. Pertinent data: The chart list cardiac catheterization in 2008 at which time the renal artery stenosis was diagnosed. She does not remember having had a cardiac catheterization. It is listed in Cardiologist's  last two notes .  Family history is negative for premature coronary disease .   Review of Systems  She sees Dr Arrie Aran, Nephrologist every 4 months for  renal insufficiency. Labs completed 12/13     Objective:   Physical Exam Appears healthy and well-nourished & in no acute distress.Appears younger than stated age  Arcus senilis; pupils small  No carotid bruits are present.No neck pain distention present at 10 - 15 degrees. Thyroid normal to palpation  Heart rhythm and rate are normal with no significant murmurs or gallops.S4 with slurring at  LSB  Chest is clear with no increased work of breathing  There is no evidence of aortic aneurysm or renal artery bruits  Abdomen soft with no organomegaly or masses. No HJR  No clubbing, cyanosis or edema present.  Mixed arthritic hand changes  Pedal pulses are decreased  Scoliosis present of thoracic spine  No ischemic skin changes are present . Nails healthy   Alert and oriented. Strength, tone, DTRs reflexes normal                                                              Assessment & Plan:

## 2012-07-28 ENCOUNTER — Encounter: Payer: Self-pay | Admitting: Internal Medicine

## 2012-07-28 NOTE — Assessment & Plan Note (Signed)
Lipids & hepatic panel due 08/2012

## 2012-07-28 NOTE — Assessment & Plan Note (Signed)
Monitored annually by Dr. Excell Seltzer. Again she denies ever having had a cardiac catheterization. This is listed in her past history in occurred in 2008;  renal artery stenosis was diagnosed at that time.

## 2012-07-28 NOTE — Assessment & Plan Note (Signed)
BP controlled; BMET monitored by Dr Arrie Aran, Nephrology

## 2012-07-28 NOTE — Assessment & Plan Note (Signed)
02/19/12 and TSH 4.23

## 2012-08-12 ENCOUNTER — Ambulatory Visit (INDEPENDENT_AMBULATORY_CARE_PROVIDER_SITE_OTHER): Payer: Medicare Other | Admitting: Cardiovascular Disease

## 2012-08-12 VITALS — BP 140/59 | HR 58 | Ht 63.0 in | Wt 146.4 lb

## 2012-08-12 DIAGNOSIS — I10 Essential (primary) hypertension: Secondary | ICD-10-CM

## 2012-08-12 NOTE — Patient Instructions (Addendum)
Your physician wants you to follow-up in: 1 year with Dr. Cooper.  You will receive a reminder letter in the mail two months in advance. If you don't receive a letter, please call our office to schedule the follow-up appointment.  

## 2012-08-12 NOTE — Progress Notes (Signed)
   HPI:  79 year old woman presenting for followup evaluation. She has lower extremity peripheral arterial disease and renal artery stenosis. She's been managed medically and has done well with this approach.  She slowed down a lot in the past year. She had some problems with her hips. Her right hip was replaced about 10 years ago and she is feeling more clicking and popping in the hip. She is having discomfort in the left hip as well. She also has low back pain.  From a cardiac perspective, she is not having problems. She denies chest pain, dyspnea, or palpitations. She's had less problems related to calf claudication, primarily because she's not doing much walking anymore.  Outpatient Encounter Prescriptions as of 08/12/2012  Medication Sig Dispense Refill  . amLODipine (NORVASC) 10 MG tablet Take 1 tablet (10 mg total) by mouth daily.  90 tablet  3  . Calcium-Vitamin D (CALTRATE 600 PLUS-VIT D PO) Take by mouth daily.       . furosemide (LASIX) 40 MG tablet Take 1 tablet (40 mg total) by mouth daily.  90 tablet  2  . levothyroxine (SYNTHROID, LEVOTHROID) 50 MCG tablet TAKE 1 TABLET DAILY EXCEPT ONE AND ONE-HALF TABLETS ON TUESDAY, THURSDAY, AND SATURDAY (DUE FOR LABWORK)  108 tablet  3  . losartan (COZAAR) 100 MG tablet TAKE 1 TABLET ONCE A DAY  90 tablet  2  . Multiple Vitamin (MULTIVITAMIN) capsule Take 1 capsule by mouth daily.        . rosuvastatin (CRESTOR) 20 MG tablet LABS DUE April 2014, 1/2 by mouth at bedtime  45 tablet  0  . traMADol (ULTRAM) 50 MG tablet TAKE ONE-HALF TO ONE TABLET BY MOUTH EVERY 8 HOURS AS NEEDED  30 tablet  2   No facility-administered encounter medications on file as of 08/12/2012.    No Known Allergies  Past Medical History  Diagnosis Date  . Coronary atherosclerosis of native coronary artery 2008    ACS; Dr Excell Seltzer  . Atherosclerosis of renal artery   . Essential hypertension, benign   . Pure hypercholesterolemia   . Carotid bruit 1998    negative  Doppler  . Renal artery stenosis     ROS: Negative except as per HPI  BP 140/59  Pulse 58  Ht 5\' 3"  (1.6 m)  Wt 146 lb 6.4 oz (66.407 kg)  BMI 25.94 kg/m2  SpO2 95%  PHYSICAL EXAM: Pt is alert and oriented, pleasant elderly woman in NAD HEENT: normal Neck: JVP - normal, carotids 2+= without bruits Lungs: CTA bilaterally CV: RRR without murmur or gallop Abd: soft, NT Ext: no C/C/E Skin: warm/dry no rash  EKG:  Normal sinus rhythm 60 beats per minute, left axis deviation, otherwise within normal limits.  ASSESSMENT AND PLAN: 1. Renal artery stenosis. Blood pressure is reasonably controlled. She tolerates losartan. She has chronic kidney disease and is followed by nephrology.  2. Hypertension. Blood pressure well controlled.  3. Lower extremity peripheral arterial disease. Currently asymptomatic. Primarily limited by osteoarthritis.  For followup I would like to see her back in one year.  Tonny Bollman 08/12/2012 1:55 PM

## 2012-09-12 ENCOUNTER — Telehealth: Payer: Self-pay | Admitting: Internal Medicine

## 2012-09-12 MED ORDER — FUROSEMIDE 40 MG PO TABS: 40.0000 mg | ORAL_TABLET | Freq: Every day | ORAL | Status: DC

## 2012-09-12 MED ORDER — LOSARTAN POTASSIUM 100 MG PO TABS: ORAL_TABLET | ORAL | Status: DC

## 2012-09-12 NOTE — Telephone Encounter (Signed)
Refill: losartan pot tab 100 mg. Take 1 by mouth daily. 90 day supply Furosemide tab 40 mg. Take 1 by mouth daily. 90 day supply

## 2012-09-12 NOTE — Telephone Encounter (Signed)
RX's sent electronically  

## 2012-10-19 ENCOUNTER — Other Ambulatory Visit: Payer: Self-pay | Admitting: Internal Medicine

## 2012-11-03 ENCOUNTER — Other Ambulatory Visit (INDEPENDENT_AMBULATORY_CARE_PROVIDER_SITE_OTHER): Payer: Medicare Other

## 2012-11-03 DIAGNOSIS — T887XXA Unspecified adverse effect of drug or medicament, initial encounter: Secondary | ICD-10-CM

## 2012-11-03 DIAGNOSIS — E785 Hyperlipidemia, unspecified: Secondary | ICD-10-CM

## 2012-11-03 LAB — LIPID PANEL
Cholesterol: 164 mg/dL (ref 0–200)
LDL Cholesterol: 98 mg/dL (ref 0–99)
Total CHOL/HDL Ratio: 3
VLDL: 18.6 mg/dL (ref 0.0–40.0)

## 2012-11-03 LAB — HEPATIC FUNCTION PANEL
Alkaline Phosphatase: 62 U/L (ref 39–117)
Bilirubin, Direct: 0.1 mg/dL (ref 0.0–0.3)
Total Protein: 6.4 g/dL (ref 6.0–8.3)

## 2012-11-16 ENCOUNTER — Encounter: Payer: Self-pay | Admitting: Internal Medicine

## 2012-11-17 ENCOUNTER — Other Ambulatory Visit: Payer: Self-pay | Admitting: Internal Medicine

## 2012-11-17 NOTE — Telephone Encounter (Signed)
OK X1 

## 2012-11-17 NOTE — Telephone Encounter (Signed)
Ok to refill?  Pt. Last OV 07/27/12, has one scheduled tomorrow 6/27.  Last filled 10/19/12

## 2012-11-18 ENCOUNTER — Encounter: Payer: Self-pay | Admitting: Internal Medicine

## 2012-11-18 ENCOUNTER — Ambulatory Visit (INDEPENDENT_AMBULATORY_CARE_PROVIDER_SITE_OTHER): Payer: Medicare Other | Admitting: Internal Medicine

## 2012-11-18 ENCOUNTER — Ambulatory Visit (INDEPENDENT_AMBULATORY_CARE_PROVIDER_SITE_OTHER)
Admission: RE | Admit: 2012-11-18 | Discharge: 2012-11-18 | Disposition: A | Discharge status: Home / Self Care | Payer: Medicare Other | Source: Ambulatory Visit | Attending: Internal Medicine | Admitting: Internal Medicine

## 2012-11-18 VITALS — BP 144/72 | HR 54 | Temp 98.0°F | Wt 139.0 lb

## 2012-11-18 DIAGNOSIS — S91001A Unspecified open wound, right ankle, initial encounter: Secondary | ICD-10-CM

## 2012-11-18 DIAGNOSIS — R634 Abnormal weight loss: Secondary | ICD-10-CM

## 2012-11-18 DIAGNOSIS — S81809A Unspecified open wound, unspecified lower leg, initial encounter: Secondary | ICD-10-CM

## 2012-11-18 DIAGNOSIS — S81009A Unspecified open wound, unspecified knee, initial encounter: Secondary | ICD-10-CM

## 2012-11-18 LAB — CBC WITH DIFFERENTIAL/PLATELET
Basophils Absolute: 0 10*3/uL (ref 0.0–0.1)
HCT: 36.9 % (ref 36.0–46.0)
Hemoglobin: 12.3 g/dL (ref 12.0–15.0)
Lymphs Abs: 2.3 10*3/uL (ref 0.7–4.0)
MCV: 88.6 fl (ref 78.0–100.0)
Monocytes Absolute: 0.6 10*3/uL (ref 0.1–1.0)
Neutro Abs: 4.4 10*3/uL (ref 1.4–7.7)
Platelets: 162 10*3/uL (ref 150.0–400.0)
RDW: 14.6 % (ref 11.5–14.6)

## 2012-11-18 LAB — T4, FREE: Free T4: 0.9 ng/dL (ref 0.60–1.60)

## 2012-11-18 LAB — SEDIMENTATION RATE: Sed Rate: 34 mm/hr — ABNORMAL HIGH (ref 0–22)

## 2012-11-18 NOTE — Patient Instructions (Addendum)
Order for x-rays entered into  the computer; these will be performed at [address] [facility]. across from [facility]. No appointment is necessary. Please complete and return stool cards.  If you activate the  My Chart system; lab & Xray results will be released directly  to you as soon as I review & address these through the computer. Additionally you can use this system to gain direct  access to your records  if  out of town or @ an office of a  physician who is not in  the My Chart network.  This improves continuity of care & places you in control of your medical record.

## 2012-11-18 NOTE — Progress Notes (Signed)
Subjective:    [name], female    DOB: 1916/06/26, 79 y.o.   MRN: [id number]  HPI  She injured the right lateral malleolus approximately 5 years ago, she struck it against a piece of furniture. She states it has never healed but repeatedly forms an apparent eschar ("crust").She's been having pain when she sleeps in the right lateral decubitus position for the last year at this site.  She describes an associated sensitivity to touch. The symptoms radiate across the dorsum of the anterior ankle and foot and are associated with some numbness there  and also in her toes.  She describes a 20 pound weight loss in the last year and increased fatigue.  She does have a past history of reflux.      Review of Systems  She denies fever, chills, sweats, or purulence from lesion. She has no dysphagia,abdominal pain, melena, or rectal bleeding.  No cough or sputum. No PND  Swelling is worse in the right foot but is present to some degree on the left. She is on amlodipine 10 mg daily; she has a past history of renal artery stenosis.       Objective:   Physical Exam  Gen.: Adequately nourished in appearance. Alert, appropriate and cooperative throughout exam.Appears younger than stated age  Eyes: No corneal or conjunctival inflammation noted.No icterus. Nose: External nasal exam reveals no deformity or inflammation. Nasal mucosa are pink and moist. No lesions or exudates noted.   Mouth: Oral mucosa and oropharynx reveal no lesions or exudates. Teeth in good repair. Neck: No  masses, or tenderness noted. Torticollis. Thyroid normal. No neck vein distention at 15. Lungs: Normal respiratory effort; chest expands symmetrically. Lungs are clear to auscultation without rales, wheezes, or increased work of breathing. Heart: Slow rate and regular rhythm. Normal S1 and S2. No gallop, click, or rub. No murmur. Abdomen: Bowel sounds normal; abdomen soft and nontender. No masses,  organomegaly or hernias noted. No hepatojugular reflux present                                  Musculoskeletal/extremities: There is significant asymmetry of the posterior thoracic musculature suggesting occult scoliosis. No clubbing or cyanosis noted.R foot slightly puffy. There is no pitting edema of the feet. Concavity is noted below the malleolar structures. Range of motion decreased in LE & especially L hip .Joints  reveal marked mixed PIP & DIP changes. Chronic toenail changes. Using cane ; requires help onto table Vascular: Carotid, radial artery, dorsalis pedis and  posterior tibial pulses are equal; but pedal pulses are markedly decreased. No bruits present. Varicose veins over both feet. Neurologic: Alert and oriented x3. Deep tendon reflexes symmetrical and 1/2+         Skin: Mild erythema around an eschar at the right lateral malleolus. Lymph: No cervical, axillary lymphadenopathy present. Psych: Mood and affect are normal. Normally interactive                                                                                        Assessment &  Plan:  #1 chronic nonhealing wound right lateral malleolus associated with mild erythema. Definite cellulitis is not present. Because of chronicity; associated osteomyelitis must be ruled out.  #2 20 pound weight loss over the past year  Plan: See orders and recommendations

## 2012-11-19 ENCOUNTER — Encounter: Payer: Self-pay | Admitting: Internal Medicine

## 2012-12-08 ENCOUNTER — Other Ambulatory Visit: Payer: Self-pay | Admitting: Internal Medicine

## 2012-12-08 NOTE — Telephone Encounter (Signed)
Last filled 11/17/2012, #30/0 refills. Please advise if ok to fill with additional refill

## 2012-12-08 NOTE — Telephone Encounter (Signed)
OK x 1 

## 2012-12-12 ENCOUNTER — Encounter (HOSPITAL_BASED_OUTPATIENT_CLINIC_OR_DEPARTMENT_OTHER): Payer: Medicare Other | Attending: General Surgery

## 2012-12-12 DIAGNOSIS — I87319 Chronic venous hypertension (idiopathic) with ulcer of unspecified lower extremity: Secondary | ICD-10-CM | POA: Insufficient documentation

## 2012-12-12 DIAGNOSIS — L97909 Non-pressure chronic ulcer of unspecified part of unspecified lower leg with unspecified severity: Secondary | ICD-10-CM | POA: Insufficient documentation

## 2012-12-16 ENCOUNTER — Other Ambulatory Visit (INDEPENDENT_AMBULATORY_CARE_PROVIDER_SITE_OTHER): Payer: Medicare Other

## 2012-12-16 DIAGNOSIS — Z0000 Encounter for general adult medical examination without abnormal findings: Secondary | ICD-10-CM

## 2012-12-16 DIAGNOSIS — Z1289 Encounter for screening for malignant neoplasm of other sites: Secondary | ICD-10-CM

## 2012-12-16 LAB — HEMOCCULT GUIAC POC 1CARD (OFFICE)
Card #2 Fecal Occult Blod, POC: NEGATIVE
Card #3 Fecal Occult Blood, POC: NEGATIVE
Fecal Occult Blood, POC: NEGATIVE

## 2012-12-28 ENCOUNTER — Other Ambulatory Visit: Payer: Self-pay

## 2012-12-30 ENCOUNTER — Other Ambulatory Visit: Payer: Self-pay | Admitting: *Deleted

## 2012-12-30 MED ORDER — AMLODIPINE BESYLATE 10 MG PO TABS: 10.0000 mg | ORAL_TABLET | Freq: Every day | ORAL | Status: DC

## 2013-01-06 ENCOUNTER — Other Ambulatory Visit: Payer: Self-pay | Admitting: Internal Medicine

## 2013-01-11 ENCOUNTER — Encounter: Payer: Self-pay | Admitting: Internal Medicine

## 2013-02-03 ENCOUNTER — Encounter: Payer: Self-pay | Admitting: Cardiology

## 2013-02-07 ENCOUNTER — Other Ambulatory Visit: Payer: Self-pay | Admitting: *Deleted

## 2013-02-07 ENCOUNTER — Telehealth: Payer: Self-pay | Admitting: *Deleted

## 2013-02-07 MED ORDER — TRAMADOL HCL 50 MG PO TABS: ORAL_TABLET | ORAL | Status: DC

## 2013-02-07 NOTE — Telephone Encounter (Signed)
OK X1 

## 2013-02-07 NOTE — Telephone Encounter (Signed)
Rx refill request for Tramadol 50 mg Last ov 11/18/12 Last date filled 01/06/13 # 30 0 R Patient has contract  Last UDS 11/18/12 low risk   Please Advise  Ag cma

## 2013-02-07 NOTE — Telephone Encounter (Signed)
Rx printed for tramadol.  Ag cma

## 2013-02-08 ENCOUNTER — Other Ambulatory Visit: Payer: Self-pay | Admitting: *Deleted

## 2013-02-08 MED ORDER — ROSUVASTATIN CALCIUM 20 MG PO TABS: ORAL_TABLET | ORAL | Status: DC

## 2013-02-08 NOTE — Telephone Encounter (Signed)
Rx was refilled for crestor 20 mg   Ag cma

## 2013-02-14 ENCOUNTER — Telehealth: Payer: Self-pay | Admitting: Internal Medicine

## 2013-02-14 NOTE — Telephone Encounter (Signed)
Patient wanted to see why we have not responded to her [facility] for her crestor. Thanks

## 2013-02-15 ENCOUNTER — Telehealth: Payer: Self-pay | Admitting: *Deleted

## 2013-02-15 NOTE — Telephone Encounter (Signed)
Called and spoke with patient regarding her cost of Crestor and informed her of Dr. Caryl Never response. She stated that she would make an appointment.

## 2013-02-15 NOTE — Telephone Encounter (Signed)
Please see me before refilling meds. Please bring any drug coverage list.  We'll assess the risks and benefits of continuing any cholesterol medicine and if so which would be the most cost effective and more importantly therapeutic.

## 2013-02-15 NOTE — Telephone Encounter (Signed)
Heather Morrison from [place] called regarding patients prescription for Crestor. He stated that her recent prescription dose doubled from the previous and the price is higher then the patient would like to pay. Please advise.

## 2013-02-16 ENCOUNTER — Encounter: Payer: Self-pay | Admitting: Internal Medicine

## 2013-02-16 NOTE — Telephone Encounter (Signed)
Refill has been taking care of.//AB/CMA

## 2013-02-17 ENCOUNTER — Other Ambulatory Visit: Payer: Self-pay | Admitting: Internal Medicine

## 2013-02-17 ENCOUNTER — Ambulatory Visit (INDEPENDENT_AMBULATORY_CARE_PROVIDER_SITE_OTHER): Payer: Medicare Other | Admitting: Internal Medicine

## 2013-02-17 ENCOUNTER — Encounter: Payer: Self-pay | Admitting: Internal Medicine

## 2013-02-17 VITALS — BP 145/65 | HR 59 | Temp 97.9°F | Resp 18 | Wt 141.0 lb

## 2013-02-17 DIAGNOSIS — E785 Hyperlipidemia, unspecified: Secondary | ICD-10-CM

## 2013-02-17 NOTE — Patient Instructions (Addendum)
Risk of premature heart attack or stroke increases as LDL or BAD cholesterol rises,especially if there is family history of heart attack in males before 57 or women before 33. An advanced testing is recommended to establish present risk &  LDL goal . Dr Gildardo Griffes book Eat, Drink & Be Healthy contains the best  dietary cholesterol information.

## 2013-02-17 NOTE — Progress Notes (Signed)
  Subjective:    [name], female    DOB: 1916/08/10, 79 y.o.   MRN: [id number]  HPI She is on a heart healthy diet; she exercises of extremities while supine 20 minutes 2 times daily without symptoms. Specifically she denies chest pain, palpitations, dyspnea, or claudication.   Family history is negative for premature coronary disease .  Advanced cholesterol testing not done. There is medication compliance with the statin; but Crestor costs $75 for 3 months by mail order.    Review of Systems Significant abdominal symptoms, memory deficit, or myalgias denied.      Objective:   Physical Exam Appears healthy and well-nourished & in no acute distress.Appears much  younger than stated age  No carotid bruits are present.No neck pain distention present at 10 - 15 degrees.  Heart rhythm and rate are normal with no significant murmurs or gallops. S4 with slight slurring  Chest is clear with no increased work of breathing  There is no evidence of aortic aneurysm or renal artery bruits  Abdomen soft with no organomegaly or masses. No HJR  No clubbing, cyanosis or edema present.  Pedal pulses are intact   No ischemic skin changes are present .    Alert and oriented. Strength, tone normal. Scoliosis          Assessment & Plan:  See Current Assessment & Plan in Problem List under specific Diagnosis

## 2013-02-17 NOTE — Assessment & Plan Note (Signed)
NMR Lipoprofile to optimally assess risk 

## 2013-02-20 ENCOUNTER — Encounter: Payer: Self-pay | Admitting: Internal Medicine

## 2013-02-20 ENCOUNTER — Other Ambulatory Visit: Payer: Self-pay | Admitting: Internal Medicine

## 2013-02-20 DIAGNOSIS — E785 Hyperlipidemia, unspecified: Secondary | ICD-10-CM

## 2013-02-20 LAB — NMR LIPOPROFILE WITH LIPIDS
Cholesterol, Total: 130 mg/dL (ref <200)
HDL Particle Number: 30.6 umol/L (ref >=30.5)
HDL-C: 51 mg/dL (ref >=40)
LDL (calc): 60 mg/dL (ref <100)
LP-IR Score: <25 (ref <=45)

## 2013-03-02 ENCOUNTER — Other Ambulatory Visit: Payer: Self-pay | Admitting: Internal Medicine

## 2013-03-03 ENCOUNTER — Telehealth: Payer: Self-pay | Admitting: *Deleted

## 2013-03-03 MED ORDER — TRAMADOL HCL 50 MG PO TABS: ORAL_TABLET | ORAL | Status: DC

## 2013-03-03 NOTE — Telephone Encounter (Signed)
Medication refilled

## 2013-03-03 NOTE — Telephone Encounter (Signed)
OK X1 

## 2013-03-03 NOTE — Telephone Encounter (Signed)
Tramadol Last OV: 02/17/2013 Last refill: 02/07/2013 UDS up-to-date  Low risk

## 2013-03-13 ENCOUNTER — Other Ambulatory Visit: Payer: Self-pay | Admitting: *Deleted

## 2013-03-13 MED ORDER — LEVOTHYROXINE SODIUM 50 MCG PO TABS: ORAL_TABLET | ORAL | Status: DC

## 2013-03-13 NOTE — Telephone Encounter (Signed)
Levothyroxine refill sent to pharmacy 

## 2013-03-14 ENCOUNTER — Other Ambulatory Visit: Payer: Self-pay | Admitting: *Deleted

## 2013-03-14 DIAGNOSIS — E039 Hypothyroidism, unspecified: Secondary | ICD-10-CM

## 2013-03-14 MED ORDER — LEVOTHYROXINE SODIUM 50 MCG PO TABS: ORAL_TABLET | ORAL | Status: DC

## 2013-03-14 NOTE — Telephone Encounter (Signed)
Rx for levothyroxine sent to [facility].

## 2013-03-30 ENCOUNTER — Other Ambulatory Visit: Payer: Self-pay | Admitting: Internal Medicine

## 2013-03-31 NOTE — Telephone Encounter (Signed)
OK X1 

## 2013-03-31 NOTE — Telephone Encounter (Signed)
traMADol (ULTRAM) 50 MG tablet Last OV: 02/17/2013 Last refill: 03/03/2013 x 30 UDS up-to-date; low risk

## 2013-04-18 ENCOUNTER — Other Ambulatory Visit: Payer: Self-pay | Admitting: Internal Medicine

## 2013-04-19 NOTE — Telephone Encounter (Signed)
OK X1 

## 2013-04-19 NOTE — Telephone Encounter (Signed)
traMADol (ULTRAM) 50 MG tablet Last refill: 03/30/2013, #30 Last OV: 02/17/2013 UDS up-to-date low risk

## 2013-05-22 ENCOUNTER — Other Ambulatory Visit: Payer: Self-pay | Admitting: Internal Medicine

## 2013-05-22 NOTE — Telephone Encounter (Signed)
traMADol (ULTRAM) 50 MG tablet Last refill: 04/18/13 #30, 0 refills Last OV: 02/17/2013 Contract on file, low risk

## 2013-05-22 NOTE — Telephone Encounter (Signed)
OK X1 

## 2013-05-23 NOTE — Telephone Encounter (Signed)
Tramadol script faxed to pharmacy. JG//CMA 

## 2013-06-14 ENCOUNTER — Encounter: Payer: Self-pay | Admitting: Internal Medicine

## 2013-06-19 ENCOUNTER — Other Ambulatory Visit: Payer: Self-pay | Admitting: Internal Medicine

## 2013-06-19 NOTE — Telephone Encounter (Signed)
Script faxed to pharmacy. JG//CMA 

## 2013-06-19 NOTE — Telephone Encounter (Signed)
OK X1 

## 2013-06-19 NOTE — Telephone Encounter (Signed)
traMADol (ULTRAM) 50 MG tablet Last refill: 05/22/13 #30, 0 refills Last OV: 02/17/13 Last UDS: 11/18/12 Low risk

## 2013-07-05 ENCOUNTER — Encounter: Payer: Self-pay | Admitting: Internal Medicine

## 2013-07-07 ENCOUNTER — Encounter: Payer: Self-pay | Admitting: Internal Medicine

## 2013-07-14 ENCOUNTER — Other Ambulatory Visit: Payer: Self-pay | Admitting: *Deleted

## 2013-07-14 MED ORDER — LOSARTAN POTASSIUM 100 MG PO TABS: ORAL_TABLET | ORAL | Status: DC

## 2013-07-14 MED ORDER — FUROSEMIDE 40 MG PO TABS: 40.0000 mg | ORAL_TABLET | Freq: Every day | ORAL | Status: DC

## 2013-07-14 NOTE — Telephone Encounter (Signed)
Rx's sent to the pharmacy by e-script.//AB/CMA 

## 2013-07-21 ENCOUNTER — Other Ambulatory Visit: Payer: Self-pay | Admitting: *Deleted

## 2013-07-21 MED ORDER — TRAMADOL HCL 50 MG PO TABS: ORAL_TABLET | ORAL | Status: DC

## 2013-07-21 NOTE — Telephone Encounter (Signed)
Rx printed and faxed to the pharmacy.//AB/CMA 

## 2013-08-16 ENCOUNTER — Encounter: Payer: Self-pay | Admitting: Internal Medicine

## 2013-08-18 ENCOUNTER — Other Ambulatory Visit: Payer: Self-pay | Admitting: Internal Medicine

## 2013-08-18 ENCOUNTER — Telehealth: Payer: Self-pay | Admitting: Internal Medicine

## 2013-08-18 MED ORDER — TRAMADOL HCL 50 MG PO TABS: ORAL_TABLET | ORAL | Status: DC

## 2013-08-18 NOTE — Telephone Encounter (Signed)
Patient is calling to request a refill on her traMADol (ULTRAM) 50 MG tablet. She states that she called her pharmacy on Wed, 3/25 to request. Patient [facility] [address]

## 2013-08-18 NOTE — Telephone Encounter (Signed)
Rx printed and faxed to the pharmacy.//AB/CMA 

## 2013-08-18 NOTE — Telephone Encounter (Signed)
Requesting Tramadol 50mg -Take 1/2 to 1 tablet by mouth every 8 hours as needed. Last refill:07-21-13;#30,0 Last OV:02-17-13 UDS:11-18-12:Low risk Please advise.//AB/CMA

## 2013-08-21 ENCOUNTER — Ambulatory Visit (INDEPENDENT_AMBULATORY_CARE_PROVIDER_SITE_OTHER): Payer: Medicare Other | Admitting: Physician Assistant

## 2013-08-21 ENCOUNTER — Encounter: Payer: Self-pay | Admitting: Physician Assistant

## 2013-08-21 VITALS — BP 142/58 | HR 53 | Ht 63.0 in | Wt 144.8 lb

## 2013-08-21 DIAGNOSIS — I2510 Atherosclerotic heart disease of native coronary artery without angina pectoris: Secondary | ICD-10-CM | POA: Insufficient documentation

## 2013-08-21 DIAGNOSIS — I10 Essential (primary) hypertension: Secondary | ICD-10-CM

## 2013-08-21 DIAGNOSIS — I701 Atherosclerosis of renal artery: Secondary | ICD-10-CM

## 2013-08-21 NOTE — Progress Notes (Addendum)
[address] [place], [place]  [address] Phone: [phone] Fax:  [phone]  Date:  08/21/2013   ID:  Heather Morrison, DOB Sep 29, 1916, MRN [id number]  PCP:  Marga Melnick, MD  Cardiologist:  Dr. Tonny Bollman   Nephrologist:  Dr. Arrie Aran    History of Present Illness: Heather Morrison is a 80 y.o. female with a history of CAD, renal artery stenosis, HTN, HL, CKD.  She has been managed medically over time. Last seen by Dr. Excell Seltzer in 07/2012.  She stays at [facility].  She denies any chest pain, dyspnea, orthopnea, PND, edema. She has a lot of L hip pain from DJD.  She was recently placed on a round of prednisone with relief.  Her renal function is monitored by nephrology.  Studies:  - LHC (08/30/06):  pLAD 40, mid LAD 50-60, oD1 70-80, pCFX 30, pRCA 30, ostial right renal artery 70-80, left renal artery okay  - Echo (08/31/06):  EF 55%, normal wall motion, mild AI, mild MR, mild LAE, mildly increased PASP, mild RAE.   Recent Labs: 11/03/2012: ALT 11; HDL Cholesterol 47.30  11/18/2012: Hemoglobin 12.3; TSH 5.40  02/17/2013: LDL (calc) 60   Wt Readings from Last 3 Encounters:  08/21/13 144 lb 12.8 oz (65.681 kg)  02/17/13 141 lb (63.957 kg)  11/18/12 139 lb (63.05 kg)     Past Medical History  Diagnosis Date  . Coronary atherosclerosis of native coronary artery 2008    ACS; Dr Excell Seltzer  . Atherosclerosis of renal artery   . Essential hypertension, benign   . Pure hypercholesterolemia   . Carotid bruit 1998    negative Doppler  . Renal artery stenosis     Current Outpatient Prescriptions  Medication Sig Dispense Refill  . amLODipine (NORVASC) 10 MG tablet Take 1 tablet (10 mg total) by mouth daily.  90 tablet  3  . Calcium-Vitamin D (CALTRATE 600 PLUS-VIT D PO) Take by mouth daily.       . furosemide (LASIX) 40 MG tablet Take 1 tablet (40 mg total) by mouth daily.  90 tablet  1  . levothyroxine (SYNTHROID, LEVOTHROID) 50 MCG tablet TAKE 1 TABLET  DAILY EXCEPT ONE AND ONE-HALF TABLETS ON TUESDAY, THURSDAY, AND SATURDAY  108 tablet  3  . losartan (COZAAR) 100 MG tablet TAKE 1 TABLET ONCE A DAY  90 tablet  1  . Multiple Vitamin (MULTIVITAMIN) capsule Take 1 capsule by mouth daily.        . rosuvastatin (CRESTOR) 20 MG tablet Take 1/2 of a tablet by mouth daily at bedtime  90 tablet  1  . traMADol (ULTRAM) 50 MG tablet TAKE 1/2-1 TABLET BY MOUTH EVERY 8 HOURS AS NEEDED.  30 tablet  0   No current facility-administered medications for this visit.    Allergies:   Review of patient's allergies indicates no known allergies.   Social History:  The patient  reports that she has never smoked. She does not have any smokeless tobacco history on file. She reports that she does not drink alcohol or use illicit drugs.   Family History:  The patient's family history includes Breast cancer in her daughter; Hypertension in her father; Stroke in her father. There is no history of Diabetes or Heart disease.   ROS:  Please see the history of present illness.      All other systems reviewed and negative.   PHYSICAL EXAM: VS:  BP 142/58  Pulse 53  Ht 5'  3" (1.6 m)  Wt 144 lb 12.8 oz (65.681 kg)  BMI 25.66 kg/m2 Well nourished, well developed, in no acute distress HEENT: normal Neck:  no JVD Cardiac:  normal S1, S2; RRR; no murmur Lungs:  clear to auscultation bilaterally, no wheezing, rhonchi or rales Abd: soft, nontender, no hepatomegaly Ext: no edema Skin: warm and dry Neuro:  CNs 2-12 intact, no focal abnormalities noted  EKG:  Sinus bradycardia, HR 53, low-voltage, nonspecific ST-T wave changes     ASSESSMENT AND PLAN:  1. CAD:  No angina.  Continue current Rx.  2. Hypertension:  Reasonable control.  Continue current Rx. 3. Renal Artery Stenosis: BP remains controlled.  Renal function is followed by nephrology.  4. Hyperlipidemia:  Continue statin.  5. Chronic Kidney Disease:  Renal function is monitored by nephrology.  6. Disposition:   F/u with Dr. Tonny BollmanMichael Cooper in 12 mos.  Signed, Tereso NewcomerScott Lakenzie Mcclafferty, PA-C  08/21/2013 12:09 PM

## 2013-08-21 NOTE — Patient Instructions (Signed)
Your physician recommends that you schedule a follow-up appointment in:  1 year with Dr. Tonny BollmanMichael Cooper.

## 2013-09-14 ENCOUNTER — Other Ambulatory Visit: Payer: Self-pay | Admitting: Internal Medicine

## 2013-09-14 NOTE — Telephone Encounter (Signed)
Last ov with you on 02/17/2013 Med last filled 08/18/13 #30 no refills

## 2013-09-14 NOTE — Telephone Encounter (Signed)
OK X1 

## 2013-10-11 ENCOUNTER — Other Ambulatory Visit: Payer: Self-pay | Admitting: Internal Medicine

## 2013-10-11 NOTE — Telephone Encounter (Signed)
Last ov 02/17/2013 Med last filled 09/14/2013 #30

## 2013-10-11 NOTE — Telephone Encounter (Signed)
OK X1 

## 2013-11-06 ENCOUNTER — Telehealth: Payer: Self-pay

## 2013-11-06 NOTE — Telephone Encounter (Signed)
Pt called to give updated pharmacy information. [facility] [address] [place]

## 2013-11-09 ENCOUNTER — Telehealth: Payer: Self-pay | Admitting: Internal Medicine

## 2013-11-09 NOTE — Telephone Encounter (Signed)
Left message to return call 

## 2013-11-09 NOTE — Telephone Encounter (Signed)
Patient left message that she would like to talk to Dr. Frederik PearHopper's CMA.  Please call.

## 2013-11-13 ENCOUNTER — Other Ambulatory Visit: Payer: Self-pay

## 2013-11-13 MED ORDER — TRAMADOL HCL 50 MG PO TABS: ORAL_TABLET | ORAL | Status: DC

## 2013-11-13 NOTE — Telephone Encounter (Signed)
OK X1 

## 2013-11-20 ENCOUNTER — Other Ambulatory Visit: Payer: Self-pay

## 2013-11-20 MED ORDER — FUROSEMIDE 40 MG PO TABS: 40.0000 mg | ORAL_TABLET | Freq: Every day | ORAL | Status: DC

## 2013-11-20 MED ORDER — LOSARTAN POTASSIUM 100 MG PO TABS: ORAL_TABLET | ORAL | Status: DC

## 2013-12-11 ENCOUNTER — Other Ambulatory Visit: Payer: Self-pay | Admitting: Internal Medicine

## 2013-12-12 NOTE — Telephone Encounter (Signed)
OK X1 

## 2013-12-14 ENCOUNTER — Encounter: Payer: Self-pay | Admitting: Internal Medicine

## 2013-12-14 ENCOUNTER — Emergency Department (HOSPITAL_COMMUNITY)
Admission: EM | Admit: 2013-12-14 | Discharge: 2013-12-14 | Disposition: A | Discharge status: Home / Self Care | Payer: Medicare Other | Attending: Emergency Medicine | Admitting: Emergency Medicine

## 2013-12-14 ENCOUNTER — Ambulatory Visit (INDEPENDENT_AMBULATORY_CARE_PROVIDER_SITE_OTHER): Payer: Medicare Other | Admitting: Internal Medicine

## 2013-12-14 ENCOUNTER — Encounter (HOSPITAL_COMMUNITY): Payer: Self-pay | Admitting: Emergency Medicine

## 2013-12-14 VITALS — BP 146/60 | HR 60 | Temp 98.3°F | Wt 149.0 lb

## 2013-12-14 DIAGNOSIS — I10 Essential (primary) hypertension: Secondary | ICD-10-CM | POA: Insufficient documentation

## 2013-12-14 DIAGNOSIS — L03119 Cellulitis of unspecified part of limb: Secondary | ICD-10-CM | POA: Diagnosis not present

## 2013-12-14 DIAGNOSIS — M7989 Other specified soft tissue disorders: Secondary | ICD-10-CM

## 2013-12-14 DIAGNOSIS — N289 Disorder of kidney and ureter, unspecified: Secondary | ICD-10-CM | POA: Diagnosis not present

## 2013-12-14 DIAGNOSIS — L02419 Cutaneous abscess of limb, unspecified: Secondary | ICD-10-CM | POA: Insufficient documentation

## 2013-12-14 DIAGNOSIS — I2510 Atherosclerotic heart disease of native coronary artery without angina pectoris: Secondary | ICD-10-CM | POA: Insufficient documentation

## 2013-12-14 DIAGNOSIS — Z9889 Other specified postprocedural states: Secondary | ICD-10-CM | POA: Insufficient documentation

## 2013-12-14 DIAGNOSIS — M109 Gout, unspecified: Secondary | ICD-10-CM

## 2013-12-14 DIAGNOSIS — Z8639 Personal history of other endocrine, nutritional and metabolic disease: Secondary | ICD-10-CM | POA: Insufficient documentation

## 2013-12-14 DIAGNOSIS — R609 Edema, unspecified: Secondary | ICD-10-CM

## 2013-12-14 DIAGNOSIS — Z79899 Other long term (current) drug therapy: Secondary | ICD-10-CM | POA: Diagnosis not present

## 2013-12-14 DIAGNOSIS — Z862 Personal history of diseases of the blood and blood-forming organs and certain disorders involving the immune mechanism: Secondary | ICD-10-CM | POA: Insufficient documentation

## 2013-12-14 DIAGNOSIS — L03116 Cellulitis of left lower limb: Secondary | ICD-10-CM

## 2013-12-14 DIAGNOSIS — M2550 Pain in unspecified joint: Secondary | ICD-10-CM

## 2013-12-14 DIAGNOSIS — M79609 Pain in unspecified limb: Secondary | ICD-10-CM

## 2013-12-14 LAB — BASIC METABOLIC PANEL
Anion gap: 14 (ref 5–15)
BUN: 47 mg/dL — ABNORMAL HIGH (ref 6–23)
CALCIUM: 10.1 mg/dL (ref 8.4–10.5)
CO2: 26 mEq/L (ref 19–32)
Chloride: 101 mEq/L (ref 96–112)
Creatinine, Ser: 2.19 mg/dL — ABNORMAL HIGH (ref 0.50–1.10)
GFR calc Af Amer: 21 mL/min — ABNORMAL LOW (ref >90)
GFR, EST NON AFRICAN AMERICAN: 18 mL/min — AB (ref >90)
Glucose, Bld: 90 mg/dL (ref 70–99)
Potassium: 4.3 mEq/L (ref 3.7–5.3)
SODIUM: 141 meq/L (ref 137–147)

## 2013-12-14 LAB — CBC WITH DIFFERENTIAL/PLATELET
Basophils Absolute: 0 10*3/uL (ref 0.0–0.1)
Basophils Relative: 0 % (ref 0–1)
Eosinophils Absolute: 0.2 10*3/uL (ref 0.0–0.7)
Eosinophils Relative: 3 % (ref 0–5)
HCT: 39.4 % (ref 36.0–46.0)
Hemoglobin: 12.4 g/dL (ref 12.0–15.0)
Lymphocytes Relative: 33 % (ref 12–46)
Lymphs Abs: 2.5 10*3/uL (ref 0.7–4.0)
MCH: 28.6 pg (ref 26.0–34.0)
MCHC: 31.5 g/dL (ref 30.0–36.0)
MCV: 90.8 fL (ref 78.0–100.0)
Monocytes Absolute: 0.5 10*3/uL (ref 0.1–1.0)
Monocytes Relative: 7 % (ref 3–12)
Neutro Abs: 4.2 10*3/uL (ref 1.7–7.7)
Neutrophils Relative %: 57 % (ref 43–77)
Platelets: 177 10*3/uL (ref 150–400)
RBC: 4.34 MIL/uL (ref 3.87–5.11)
RDW: 12.8 % (ref 11.5–15.5)
WBC: 7.4 10*3/uL (ref 4.0–10.5)

## 2013-12-14 LAB — PROTIME-INR
INR: 0.95 (ref 0.00–1.49)
Prothrombin Time: 12.7 seconds (ref 11.6–15.2)

## 2013-12-14 LAB — APTT: aPTT: 32 seconds (ref 24–37)

## 2013-12-14 MED ORDER — CEPHALEXIN 500 MG PO CAPS: 500.0000 mg | ORAL_CAPSULE | Freq: Four times a day (QID) | ORAL | Status: DC

## 2013-12-14 NOTE — ED Notes (Signed)
Pt reports L leg pain x2 weeks. Swelling, redness and warmth. Sent from Dr. Alwyn RenHopper for DVT eval. Denies sob, chest pain. Reports swelling to tops of bilateral feet as well.

## 2013-12-14 NOTE — Patient Instructions (Signed)
Go to the emergency room to rule out clot & cellulitis as we discussed.

## 2013-12-14 NOTE — Progress Notes (Signed)
Pre visit review using our clinic review tool, if applicable. No additional management support is needed unless otherwise documented below in the visit note. 

## 2013-12-14 NOTE — ED Provider Notes (Signed)
CSN: [id number]     Arrival date & time 12/14/13  1414 History   First MD Initiated Contact with Patient 12/14/13 1543     Chief Complaint  Patient presents with  . Leg Pain     (Consider location/radiation/quality/duration/timing/severity/associated sxs/prior Treatment) The history is provided by the patient and a relative.    Patient sent from PCP Dr Alwyn RenHopper for 2 weeks of progressive left lower extremity erythema, edema, tightness.  Concern for DVT vs cellulitis.  Pt states she did accidentally scrape her left shin with her fingernail but the area has scabbed and she feels that this has been healing well.  She has never had a blood clot before but does have limited mobility secondary to chronic left hip pain.  Has remote hx vein stripping of left leg.  Denies fevers, chest pain, SOB.  She does not take any exogenous estrogen.  No known hx CA.   Pt reports she is otherwise feeling very well.    Past Medical History  Diagnosis Date  . Coronary atherosclerosis of native coronary artery 2008    ACS; Dr Excell Seltzerooper  . Atherosclerosis of renal artery   . Essential hypertension, benign   . Pure hypercholesterolemia   . Carotid bruit 1998    negative Doppler  . Renal artery stenosis    Past Surgical History  Procedure Laterality Date  . Varicose vein surgery  1978  . Partial hip arthroplasty  2004  . Cataract extraction    . Total abdominal hysterectomy with uso      Dr Shea Evansunn, for fibroids  . Cardiac catherization  2008    cardiac cath and abdominal aortogram performed 08/30/2006   Family History  Problem Relation Age of Onset  . Hypertension Father   . Stroke Father     in 3070s  . Breast cancer Daughter   . Diabetes Neg Hx   . Heart disease Neg Hx    History  Substance Use Topics  . Smoking status: Never Smoker   . Smokeless tobacco: Not on file  . Alcohol Use: No   OB History   Grav Para Term Preterm Abortions TAB SAB Ect Mult Living                 Review of Systems  All  other systems reviewed and are negative.     Allergies  Review of patient's allergies indicates no known allergies.  Home Medications   Prior to Admission medications   Medication Sig Start Date End Date Taking? Authorizing Provider  amLODipine (NORVASC) 10 MG tablet Take 1 tablet (10 mg total) by mouth daily. 12/30/12   Tonny BollmanMichael Cooper, MD  Calcium-Vitamin D (CALTRATE 600 PLUS-VIT D PO) Take by mouth daily.     Historical Provider, MD  furosemide (LASIX) 40 MG tablet Take 1 tablet (40 mg total) by mouth daily. 11/20/13   Pecola LawlessWilliam F Hopper, MD  levothyroxine (SYNTHROID, LEVOTHROID) 50 MCG tablet TAKE 1 TABLET DAILY EXCEPT ONE AND ONE-HALF TABLETS ON TUESDAY, THURSDAY, AND SATURDAY 03/14/13   Pecola LawlessWilliam F Hopper, MD  losartan (COZAAR) 100 MG tablet TAKE 1 TABLET ONCE A DAY 11/20/13   Pecola LawlessWilliam F Hopper, MD  Multiple Vitamin (MULTIVITAMIN) capsule Take 1 capsule by mouth daily.      Historical Provider, MD  traMADol (ULTRAM) 50 MG tablet TAKE ONE-HALF TO ONE TABLET EVERY 8 HOURS AS NEEDED    Pecola LawlessWilliam F Hopper, MD   BP 159/59  Pulse 63  Temp(Src) 98.3 F (36.8 C) (Oral)  Resp 16  SpO2 98% Physical Exam  Nursing note and vitals reviewed. Constitutional: She appears well-developed and well-nourished. No distress.  HENT:  Head: Normocephalic and atraumatic.  Neck: Neck supple.  Cardiovascular: Normal rate and regular rhythm.   Pulmonary/Chest: Effort normal and breath sounds normal. No respiratory distress. She has no wheezes. She has no rales.  Abdominal: Soft. She exhibits no distension. There is no tenderness. There is no rebound and no guarding.  Musculoskeletal:       Legs: Neurological: She is alert.  Skin: She is not diaphoretic.    ED Course  Procedures (including critical care time) Labs Review Labs Reviewed  BASIC METABOLIC PANEL - Abnormal; Notable for the following:    BUN 47 (*)    Creatinine, Ser 2.19 (*)    GFR calc non Af Amer 18 (*)    GFR calc Af Amer 21 (*)    All  other components within normal limits  CBC WITH DIFFERENTIAL  PROTIME-INR  APTT    Imaging Review No results found.   EKG Interpretation None       Discussed pt with Dr Wilkie Aye who will also see the patient.  MDM   Final diagnoses:  Cellulitis of left lower extremity without foot  Renal insufficiency    Afebrile nontoxic patient left lower extremity edema, erythema, calf tenderness.  Central abrasion with scab intact.  Doppler US negative for DVT  Labs show worsening of renal insufficiency.  Discussed patient, results and plan with Dr Wilkie Aye who has also seen the patient.  Plan is for d/c home with keflex, recheck in 2 days or sooner for worsening symptoms.  Also advised she will need recheck of renal function by PCP or nephrologist.  Patient agrees with this plan.  Discussed result, findings, treatment, and follow up  with patient.  Pt given return precautions.  Pt verbalizes understanding and agrees with plan.         Trixie Dredge, PA-C 12/14/13 1947

## 2013-12-14 NOTE — Progress Notes (Signed)
   Subjective:    [name], female    DOB: 02/16/1917, 80 y.o.   MRN: [id number]  HPI  She presents with erythema and swelling of the left lower extremity which has been progressive over the last 2 weeks. She believes she may have scratched the left shin taking off her stocking. This was followed by erythema which has been progressive and increasing swelling.  Significant past history includes vein stripping on the left.    Review of Systems  She denies chest pain, palpitations, dyspnea, paroxysmal nocturnal dyspnea.       Objective:   Physical Exam   She is in no acute distress but in the left lower extremity findings are dramatic.  She appears younger than her stated age  She has no conjunctivitis or scleral icterus  Chest is clear with no increased work of breathing  She has a regular rhythm with no significant murmurs or gallops  Abdomen slightly protuberant but there is no tenderness or masses noted  Pedal pulses are equal but decreased throughout.  There is visible swelling of the left lower extremity with tense edema causing a "shiney" appearance to the skin. There is a dramatically positive Homans sign on the left.  There is erythema over the mid to lower shin on the left. There is a central eschar present       Assessment & Plan:  #1 clinical deep venous thrombosis  #2 possible cellulitis  Plan: She'll be sent to the emergency room for venous Doppler. Consideration would be given to go on her buttocks for the clinical cellulitis  Because of her advanced age and the dramatic presentation; I would hesitate to draw a d-dimer and simply place her on Xarelto.  The extent of the possible cellulitis and clinically appeared toward IV antibiotics rather than oral antibiotics.

## 2013-12-14 NOTE — Discharge Instructions (Signed)
Read the information below.  Use the prescribed medication as directed.  Please discuss all new medications with your pharmacist.  You may return to the Emergency Department at any time for worsening condition or any new symptoms that concern you.  If you develop increased redness, swelling, pus draining from the wound, or fevers greater than 100.4, return to the ER immediately for a recheck.  ° ° °Cellulitis °Cellulitis is an infection of the skin and the tissue beneath it. The infected area is usually red and tender. Cellulitis occurs most often in the arms and lower legs.  °CAUSES  °Cellulitis is caused by bacteria that enter the skin through cracks or cuts in the skin. The most common types of bacteria that cause cellulitis are staphylococci and streptococci. °SIGNS AND SYMPTOMS  °· Redness and warmth. °· Swelling. °· Tenderness or pain. °· Fever. °DIAGNOSIS  °Your health care provider can usually determine what is wrong based on a physical exam. Blood tests may also be done. °TREATMENT  °Treatment usually involves taking an antibiotic medicine. °HOME CARE INSTRUCTIONS  °· Take your antibiotic medicine as directed by your health care provider. Finish the antibiotic even if you start to feel better. °· Keep the infected arm or leg elevated to reduce swelling. °· Apply a warm cloth to the affected area up to 4 times per day to relieve pain. °· Take medicines only as directed by your health care provider. °· Keep all follow-up visits as directed by your health care provider. °SEEK MEDICAL CARE IF:  °· You notice red streaks coming from the infected area. °· Your red area gets larger or turns dark in color. °· Your bone or joint underneath the infected area becomes painful after the skin has healed. °· Your infection returns in the same area or another area. °· You notice a swollen bump in the infected area. °· You develop new symptoms. °· You have a fever. °SEEK IMMEDIATE MEDICAL CARE IF:  °· You feel very  sleepy. °· You develop vomiting or diarrhea. °· You have a general ill feeling (malaise) with muscle aches and pains. °MAKE SURE YOU:  °· Understand these instructions. °· Will watch your condition. °· Will get help right away if you are not doing well or get worse. °Document Released: 02/18/2005 Document Revised: 09/25/2013 Document Reviewed: 07/27/2011 °ExitCare® Patient Information ©2015 [employer]. This information is not intended to replace advice given to you by your health care provider. Make sure you discuss any questions you have with your health care provider. ° °

## 2013-12-14 NOTE — Progress Notes (Signed)
Left lower extremity venous duplex completed.  Left:  No evidence of DVT, superficial thrombosis, or Baker's cyst.  Right:  Negative for DVT in the common femoral vein.  

## 2013-12-14 NOTE — Progress Notes (Signed)
   Subjective:    [name], female    DOB: [birth year], 80 y.o.   MRN: [id number]  HPI Pt presents with unilateal swelling and redness in her LLE x 2 weeks. Symptoms have worsened over past two weeks. The pt does not describe pain, but states her L calf feels very tight, particularly when she walks. The pt has no personal history of DVT nor has she flown recently. She has had a vein stripping done to her LLE in the past.  The pt did scrape her leg and noticed a small abrasion over her L shin. She does believe this happened before her symptoms began but is not certain. The abrasion is scabbed over and healing well. The L leg is not warm to touch. There has been no associated fevers/chills, sweats.   Review of Systems  Constitutional: Negative for fever and chills.  Respiratory: Negative for cough and shortness of breath.   Cardiovascular: Negative for chest pain and palpitations.      Objective:   Physical Exam LLE erythematous skin over calf and shin Small scab over L shin Taut shiny skin over LLE  Not warm to touch - homan's     Assessment & Plan:  #1 edema; r/o DVT with dopplers.

## 2013-12-16 ENCOUNTER — Encounter (HOSPITAL_COMMUNITY): Payer: Self-pay | Admitting: Emergency Medicine

## 2013-12-16 ENCOUNTER — Emergency Department (HOSPITAL_COMMUNITY)
Admission: EM | Admit: 2013-12-16 | Discharge: 2013-12-16 | Disposition: A | Discharge status: Home / Self Care | Payer: Medicare Other | Attending: Emergency Medicine | Admitting: Emergency Medicine

## 2013-12-16 DIAGNOSIS — L03119 Cellulitis of unspecified part of limb: Principal | ICD-10-CM

## 2013-12-16 DIAGNOSIS — I2510 Atherosclerotic heart disease of native coronary artery without angina pectoris: Secondary | ICD-10-CM | POA: Insufficient documentation

## 2013-12-16 DIAGNOSIS — Z9889 Other specified postprocedural states: Secondary | ICD-10-CM | POA: Diagnosis not present

## 2013-12-16 DIAGNOSIS — I10 Essential (primary) hypertension: Secondary | ICD-10-CM | POA: Insufficient documentation

## 2013-12-16 DIAGNOSIS — L02419 Cutaneous abscess of limb, unspecified: Secondary | ICD-10-CM | POA: Diagnosis not present

## 2013-12-16 DIAGNOSIS — L03116 Cellulitis of left lower limb: Secondary | ICD-10-CM

## 2013-12-16 DIAGNOSIS — Z79899 Other long term (current) drug therapy: Secondary | ICD-10-CM | POA: Insufficient documentation

## 2013-12-16 DIAGNOSIS — Z792 Long term (current) use of antibiotics: Secondary | ICD-10-CM | POA: Diagnosis not present

## 2013-12-16 LAB — CBC WITH DIFFERENTIAL/PLATELET
Basophils Absolute: 0 10*3/uL (ref 0.0–0.1)
Basophils Relative: 0 % (ref 0–1)
EOS ABS: 0.2 10*3/uL (ref 0.0–0.7)
EOS PCT: 3 % (ref 0–5)
HEMATOCRIT: 36.7 % (ref 36.0–46.0)
Hemoglobin: 11.9 g/dL — ABNORMAL LOW (ref 12.0–15.0)
LYMPHS ABS: 2.1 10*3/uL (ref 0.7–4.0)
Lymphocytes Relative: 27 % (ref 12–46)
MCH: 29.2 pg (ref 26.0–34.0)
MCHC: 32.4 g/dL (ref 30.0–36.0)
MCV: 90 fL (ref 78.0–100.0)
MONO ABS: 0.7 10*3/uL (ref 0.1–1.0)
Monocytes Relative: 10 % (ref 3–12)
Neutro Abs: 4.6 10*3/uL (ref 1.7–7.7)
Neutrophils Relative %: 60 % (ref 43–77)
Platelets: 164 10*3/uL (ref 150–400)
RBC: 4.08 MIL/uL (ref 3.87–5.11)
RDW: 12.8 % (ref 11.5–15.5)
WBC: 7.7 10*3/uL (ref 4.0–10.5)

## 2013-12-16 NOTE — ED Provider Notes (Signed)
CSN: [id number]     Arrival date & time 12/16/13  1015 History   First MD Initiated Contact with Patient 12/16/13 1054     Chief Complaint  Patient presents with  . Leg Pain  . Cellulitis   HPI The patient presented to the emergency room for plan followup on her lower extremity cellulitis. The patient was seen in the emergency department 2 days ago for evaluation of swelling and redness of her left lower extremity. She had been seen in her primary doctor's office and was referred to the emergency room to have an evaluation for a venous thrombosis. The patient had an ultrasound that did not show any evidence of a DVT. Her symptoms were felt to be related to a lower chin the cellulitis. She was started on Keflex. Patient was told to return to the emergency room in 2 days to be rechecked unless her symptoms were completely resolved. The patient states her symptoms are still present although they are improving. She denies any trouble with fevers or vomiting. Past Medical History  Diagnosis Date  . Coronary atherosclerosis of native coronary artery 2008    ACS; Dr Excell Seltzerooper  . Atherosclerosis of renal artery   . Essential hypertension, benign   . Pure hypercholesterolemia   . Carotid bruit 1998    negative Doppler  . Renal artery stenosis    Past Surgical History  Procedure Laterality Date  . Varicose vein surgery  1978  . Partial hip arthroplasty  2004  . Cataract extraction    . Total abdominal hysterectomy with uso      Dr Shea Evansunn, for fibroids  . Cardiac catherization  2008    cardiac cath and abdominal aortogram performed 08/30/2006   Family History  Problem Relation Age of Onset  . Hypertension Father   . Stroke Father     in 3970s  . Breast cancer Daughter   . Diabetes Neg Hx   . Heart disease Neg Hx    History  Substance Use Topics  . Smoking status: Never Smoker   . Smokeless tobacco: Not on file  . Alcohol Use: No   OB History   Grav Para Term Preterm Abortions TAB SAB Ect  Mult Living                 Review of Systems  All other systems reviewed and are negative.     Allergies  Review of patient's allergies indicates no known allergies.  Home Medications   Prior to Admission medications   Medication Sig Start Date End Date Taking? Authorizing Provider  amLODipine (NORVASC) 10 MG tablet Take 1 tablet (10 mg total) by mouth daily. 12/30/12  Yes Tonny BollmanMichael Cooper, MD  Calcium-Vitamin D (CALTRATE 600 PLUS-VIT D PO) Take 1 tablet by mouth every evening.    Yes Historical Provider, MD  cephALEXin (KEFLEX) 500 MG capsule Take 1 capsule (500 mg total) by mouth 4 (four) times daily. 12/14/13  Yes Trixie DredgeEmily West, PA-C  furosemide (LASIX) 40 MG tablet Take 1 tablet (40 mg total) by mouth daily. 11/20/13  Yes Pecola LawlessWilliam F Hopper, MD  levothyroxine (SYNTHROID, LEVOTHROID) 50 MCG tablet Take 50-75 mcg by mouth at bedtime. Takes 50mcg everyday except 75mcg on Tuesday, Thursday, and Saturday   Yes Historical Provider, MD  losartan (COZAAR) 100 MG tablet Take 100 mg by mouth daily with breakfast.   Yes Historical Provider, MD  Multiple Vitamin (MULTIVITAMIN) capsule Take 1 capsule by mouth every evening.    Yes Historical Provider, MD  traMADol (ULTRAM) 50 MG tablet Take 25 mg by mouth every 8 (eight) hours as needed for moderate pain.   Yes Historical Provider, MD  white petrolatum (VASELINE) GEL Apply 1 application topically as needed for dry skin (for arms and legs).   Yes Historical Provider, MD   BP 129/50  Pulse 55  Temp(Src) 98.1 F (36.7 C) (Oral)  Resp 18  SpO2 100% Physical Exam  Nursing note and vitals reviewed. Constitutional: She appears well-developed and well-nourished. No distress.  HENT:  Head: Normocephalic and atraumatic.  Right Ear: External ear normal.  Left Ear: External ear normal.  Eyes: Conjunctivae are normal. Right eye exhibits no discharge. Left eye exhibits no discharge. No scleral icterus.  Neck: Neck supple. No tracheal deviation present.   Cardiovascular: Normal rate, regular rhythm and intact distal pulses.   Pulmonary/Chest: Effort normal and breath sounds normal. No stridor. No respiratory distress. She has no wheezes. She has no rales.  Abdominal: Soft. Bowel sounds are normal. She exhibits no distension. There is no tenderness. There is no rebound and no guarding.  Musculoskeletal: She exhibits edema and tenderness.  Very small scab noted on the anterior aspect of the left shin, no purulent drainage, surrounding erythema of the shin and calf which does not extend above the knee, no crepitus, no fluctuance  Neurological: She is alert. She has normal strength. No cranial nerve deficit (no facial droop, extraocular movements intact, no slurred speech) or sensory deficit. She exhibits normal muscle tone. She displays no seizure activity. Coordination normal.  Skin: Skin is warm and dry. No rash noted.  Psychiatric: She has a normal mood and affect.    ED Course  Procedures (including critical care time) Labs Review Labs Reviewed  CBC WITH DIFFERENTIAL - Abnormal; Notable for the following:    Hemoglobin 11.9 (*)    All other components within normal limits      MDM   Final diagnoses:  Cellulitis of left lower extremity    Patient exam is consistent with a cellulitis. She's not having any systemic symptoms. She does feel like it is getting slightly better.  I think it is reasonable for her to continue the oral antibiotics. Monitor for fever or worsening symptoms. She can followup with her Dr. this coming week.    Linwood Dibbles, MD 12/16/13 1153

## 2013-12-16 NOTE — Discharge Instructions (Signed)

## 2013-12-16 NOTE — ED Notes (Signed)
Pt states that she was here two days ago for cellulitis of the left leg.  Pt states that she was given abx and was told to come back to the ER for IV abx if she wasn't feeling better.

## 2013-12-16 NOTE — ED Provider Notes (Signed)
Medical screening examination/treatment/procedure(s) were conducted as a shared visit with non-physician practitioner(s) and myself.  I personally evaluated the patient during the encounter.   EKG Interpretation None      Patient presents with concerns for DVT vs cellulitis from PCP.  Scrapped her shin approx 2 weeks ago.  Denies systemic symptoms including fever.  Mild errythema and warmth to the left shin.  Otherwise, patient is well appearing.  DVT US neg.  Feel patient warrants trial of outpatient antibiotics given lack of systemic symptoms and subtle findings on exam.  After history, exam, and medical workup I feel the patient has been appropriately medically screened and is safe for discharge home. Pertinent diagnoses were discussed with the patient. Patient was given return precautions.  Shon Batonourtney F Horton, MD 12/16/13 [phone]

## 2013-12-17 ENCOUNTER — Encounter: Payer: Self-pay | Admitting: Internal Medicine

## 2013-12-20 ENCOUNTER — Telehealth: Payer: Self-pay | Admitting: *Deleted

## 2013-12-20 MED ORDER — CEPHALEXIN 500 MG PO CAPS: 500.0000 mg | ORAL_CAPSULE | Freq: Two times a day (BID) | ORAL | Status: DC

## 2013-12-20 NOTE — Telephone Encounter (Signed)
Notified pt with md response.../lmb 

## 2013-12-20 NOTE — Telephone Encounter (Signed)
Pt stated she will take her last antibiotic tonight. Her sxs is some what better. Still have some swelling on the top of her foot. The muscle in her calf is still sore. Pt is wanting to know should she take another round of antibiotic...Raechel Chute/lmb

## 2013-12-20 NOTE — Telephone Encounter (Signed)
Cephalexin 500 mg #10 ,1 bid

## 2013-12-26 ENCOUNTER — Ambulatory Visit (INDEPENDENT_AMBULATORY_CARE_PROVIDER_SITE_OTHER): Payer: Medicare Other | Admitting: Internal Medicine

## 2013-12-26 ENCOUNTER — Encounter: Payer: Self-pay | Admitting: Internal Medicine

## 2013-12-26 VITALS — BP 123/56 | HR 61 | Temp 97.2°F | Wt 150.2 lb

## 2013-12-26 DIAGNOSIS — L02419 Cutaneous abscess of limb, unspecified: Secondary | ICD-10-CM

## 2013-12-26 DIAGNOSIS — L03119 Cellulitis of unspecified part of limb: Secondary | ICD-10-CM

## 2013-12-26 DIAGNOSIS — R609 Edema, unspecified: Secondary | ICD-10-CM

## 2013-12-26 DIAGNOSIS — L03116 Cellulitis of left lower limb: Secondary | ICD-10-CM

## 2013-12-26 MED ORDER — DOXYCYCLINE HYCLATE 100 MG PO TABS: 100.0000 mg | ORAL_TABLET | Freq: Two times a day (BID) | ORAL | Status: DC

## 2013-12-26 NOTE — Progress Notes (Signed)
Pre visit review using our clinic review tool, if applicable. No additional management support is needed unless otherwise documented below in the visit note. 

## 2013-12-26 NOTE — Progress Notes (Signed)
   Subjective:    [name], female    DOB: 05-Jun-1916, 80 y.o.   MRN: [id number]  HPI   No significant change in erythema of the left lower extremity despite almost 2 weeks of cephalexin orally. This had occurred after she scratched her shin with her fingernail trying to remove her hose. The Doppler was negative for deep venous thrombosis.  She continues to have some swelling of the lower extremities.  She denies any purulence, fever, chills, or sweats.    Review of Systems   Chest pain, palpitations, tachycardia, exertional dyspnea, paroxysmal nocturnal dyspnea, claudication or edema are absent.       Objective:   Physical Exam  Pertinent or positive findings include: Markedly decreased pedal pulses Faint erythema from the mid left shin to the upper ankle. Trace edema @ stocking line.  Appears healthy and well-nourished & in no acute distress No carotid bruits are present.No neck pain distention present at  90 degrees. Thyroid normal to palpation Heart rhythm and rate are normal with no gallop or murmur Chest is clear with no increased work of breathing There is no evidence of aortic aneurysm or renal artery bruits Abdomen soft with no organomegaly or masses. No HJR No clubbing, cyanosis  present. No ischemic skin changes are present . Fingernails healthy Alert and oriented. Strength, tone normal            Assessment & Plan:  #1 low grade cellulitis of shin #2 edema See orders & AVS

## 2013-12-26 NOTE — Patient Instructions (Signed)
   Please stop the amlodipine and monitor your blood pressure. Blood pressure goal is an average less than 140/90. If there is no change in the edema and the blood pressure remains over 140/90; restart the amlodipine.

## 2014-01-01 ENCOUNTER — Telehealth: Payer: Self-pay | Admitting: *Deleted

## 2014-01-01 DIAGNOSIS — L03116 Cellulitis of left lower limb: Secondary | ICD-10-CM

## 2014-01-01 MED ORDER — DOXYCYCLINE HYCLATE 100 MG PO TABS: 100.0000 mg | ORAL_TABLET | Freq: Two times a day (BID) | ORAL | Status: DC

## 2014-01-01 NOTE — Telephone Encounter (Signed)
Notified pt with md response. Sent doxycycline to [facility]...Raechel Chute/lmb

## 2014-01-01 NOTE — Telephone Encounter (Signed)
Pt states she will be taking er last doxycycline in the morning. Her leg is still red, and little warm to toch. The swelling has came doe a lot. Requesting md recommendation on what she need to do next...Heather Morrison/lmb

## 2014-01-01 NOTE — Telephone Encounter (Signed)
Stay off Amlodipine & monitor BP. Doxycycline 100 mg bid #10

## 2014-01-08 ENCOUNTER — Telehealth: Payer: Self-pay | Admitting: *Deleted

## 2014-01-08 NOTE — Telephone Encounter (Signed)
Notified pt with md response.../lmb 

## 2014-01-08 NOTE — Telephone Encounter (Signed)
No but use  Aveeno Daily  Moisturizing Lotion  twice a day  for the skin changes. Bathe with moisturizing liquid soap , not bar soap.

## 2014-01-08 NOTE — Telephone Encounter (Signed)
Pt states she finish her last doxycyline on Sat. Leg is better but still lil red. Wanting to know does she need to continue taking antibiotic...Heather Morrison/lmb

## 2014-01-09 ENCOUNTER — Encounter: Payer: Self-pay | Admitting: Internal Medicine

## 2014-01-11 ENCOUNTER — Other Ambulatory Visit: Payer: Self-pay | Admitting: Internal Medicine

## 2014-01-11 NOTE — Telephone Encounter (Signed)
OK X1 

## 2014-01-11 NOTE — Telephone Encounter (Signed)
Patient last seen in office 12/26/13

## 2014-02-01 ENCOUNTER — Encounter: Payer: Self-pay | Admitting: Internal Medicine

## 2014-02-01 ENCOUNTER — Ambulatory Visit (INDEPENDENT_AMBULATORY_CARE_PROVIDER_SITE_OTHER): Payer: Medicare Other | Admitting: Internal Medicine

## 2014-02-01 ENCOUNTER — Other Ambulatory Visit (INDEPENDENT_AMBULATORY_CARE_PROVIDER_SITE_OTHER): Payer: Medicare Other

## 2014-02-01 VITALS — BP 148/68 | HR 58 | Temp 97.9°F | Resp 14 | Wt 148.8 lb

## 2014-02-01 DIAGNOSIS — R609 Edema, unspecified: Secondary | ICD-10-CM

## 2014-02-01 DIAGNOSIS — N184 Chronic kidney disease, stage 4 (severe): Secondary | ICD-10-CM

## 2014-02-01 DIAGNOSIS — I10 Essential (primary) hypertension: Secondary | ICD-10-CM

## 2014-02-01 DIAGNOSIS — E039 Hypothyroidism, unspecified: Secondary | ICD-10-CM

## 2014-02-01 DIAGNOSIS — I701 Atherosclerosis of renal artery: Secondary | ICD-10-CM

## 2014-02-01 LAB — HEPATIC FUNCTION PANEL
ALT: 12 U/L (ref 0–35)
AST: 29 U/L (ref 0–37)
Albumin: 3.5 g/dL (ref 3.5–5.2)
Alkaline Phosphatase: 67 U/L (ref 39–117)
Bilirubin, Direct: 0.1 mg/dL (ref 0.0–0.3)
TOTAL PROTEIN: 6.7 g/dL (ref 6.0–8.3)
Total Bilirubin: 0.6 mg/dL (ref 0.2–1.2)

## 2014-02-01 LAB — CBC WITH DIFFERENTIAL/PLATELET
Basophils Absolute: 0 10*3/uL (ref 0.0–0.1)
Basophils Relative: 0.5 % (ref 0.0–3.0)
Eosinophils Absolute: 0.2 10*3/uL (ref 0.0–0.7)
Eosinophils Relative: 1.8 % (ref 0.0–5.0)
HCT: 36.8 % (ref 36.0–46.0)
HEMOGLOBIN: 12.1 g/dL (ref 12.0–15.0)
Lymphocytes Relative: 29.4 % (ref 12.0–46.0)
Lymphs Abs: 2.5 10*3/uL (ref 0.7–4.0)
MCHC: 32.9 g/dL (ref 30.0–36.0)
MCV: 88.4 fl (ref 78.0–100.0)
MONOS PCT: 7.5 % (ref 3.0–12.0)
Monocytes Absolute: 0.7 10*3/uL (ref 0.1–1.0)
NEUTROS ABS: 5.3 10*3/uL (ref 1.4–7.7)
NEUTROS PCT: 60.8 % (ref 43.0–77.0)
PLATELETS: 233 10*3/uL (ref 150.0–400.0)
RBC: 4.16 Mil/uL (ref 3.87–5.11)
RDW: 13.8 % (ref 11.5–15.5)
WBC: 8.7 10*3/uL (ref 4.0–10.5)

## 2014-02-01 LAB — BASIC METABOLIC PANEL
BUN: 31 mg/dL — ABNORMAL HIGH (ref 6–23)
CALCIUM: 9.1 mg/dL (ref 8.4–10.5)
CO2: 29 meq/L (ref 19–32)
CREATININE: 2.1 mg/dL — AB (ref 0.4–1.2)
Chloride: 103 mEq/L (ref 96–112)
GFR: 23.26 mL/min — AB (ref >60.00)
GLUCOSE: 71 mg/dL (ref 70–99)
Potassium: 4.3 mEq/L (ref 3.5–5.1)
Sodium: 138 mEq/L (ref 135–145)

## 2014-02-01 LAB — TSH: TSH: 9.45 u[IU]/mL — AB (ref 0.35–4.50)

## 2014-02-01 NOTE — Assessment & Plan Note (Signed)
BMET, LFT,albumin  ,

## 2014-02-01 NOTE — Assessment & Plan Note (Signed)
TSH 

## 2014-02-01 NOTE — Progress Notes (Signed)
   Subjective:    [name], female    DOB: 1916/12/15, 80 y.o.   MRN: [id number]  HPI  She's continued to have some edema greater in left lower extremity than the right as well as erythema of the left shin. She completed 2 courses of doxycycline for possible cellulitis without response. She has now developed a vesicle on the right inferior shin area.  Her past history was updated. She has significant renal insufficiency with creatinine of 2.19 and GFR of 18. Her Nephrologist has discussed renal artery stenting to prevent progressive renal insufficiency. She's reluctant to pursue this despite the possibility of requiring dialysis in the future.  PMH of vein stripping LLE only.  She has no constitutional or abdominal symptoms.    Review of Systems Unexplained weight loss, abdominal pain, significant dyspepsia, dysphagia, melena, rectal bleeding, or persistently small caliber stools are denied.  No fever , chills or sweats.  Chest pain, palpitations, tachycardia, exertional dyspnea, paroxysmal nocturnal dyspnea,or claudication are absent.       Objective:   Physical Exam  Pertinent positive findings include: The cervical spine curvature is reversed She appears much younger then her stated age. She does have faint rales at bases. The pedal pulses are absent bilaterally. She has trace edema of the right lower extremity and 1/2+ on the left. There is diffuse erythema over the left shin which does not blanch with pressure. There is no increased temperature. There is an equivocal Homans sign the left. There is a 2.5 x 2 cm vesicle over the right inferior shin. Varicose veins & spiders of BLE. Mild hyperpigmentation R shin Pain with hip ROM   General appearance :adequately nourished; in no distress. Eyes: No conjunctival inflammation or scleral icterus is present. Oral exam: Dental hygiene is good. Lips and gums are healthy appearing.There is no oropharyngeal erythema  or exudate noted.  Heart:  No NVD @ 15 degrees. Slow rate and regular rhythm. S1 and S2 normal without gallop, murmur, click, rub or other extra sounds  Lungs:No wheezes, rhonchi,or rubs present.No increased work of breathing.  Abdomen: bowel sounds normal, soft and non-tender without masses, organomegaly or hernias noted.  No guarding or rebound. NoHJR. Skin:Warm & dry.  Intact without suspicious lesions or rashes ; no jaundice or tenting Lymphatic: No lymphadenopathy is noted about the head, neck, axilla               Assessment & Plan:  #1 peripheral vascular disease with both arterial and venous components  #2 erythema left shin without clinical cellulitis; this may represent vascular phenomena   #3 renal insufficiency in the context of renal artery stenosis  #4 vesicle most likely related to low albumin  Plan: See orders and recommendations

## 2014-02-01 NOTE — Progress Notes (Signed)
Pre visit review using our clinic review tool, if applicable. No additional management support is needed unless otherwise documented below in the visit note. 

## 2014-02-01 NOTE — Patient Instructions (Signed)
Dip gauze in  sterile saline and applied to the blister twice a day.No antibiotic ointment. The saline can be purchased at the drugstore or you can make your own .Boil cup of salt in a gallon of water. Store mixture  in a clean container.Report Warning  signs as discussed (red streaks, pus, fever, increasing pain). Your next office appointment will be determined based upon review of your pending labs . Those instructions will be transmitted to you through My Chart  Followup as needed for your acute issue. Please report any significant change in your symptoms.

## 2014-02-02 ENCOUNTER — Other Ambulatory Visit: Payer: Self-pay | Admitting: Internal Medicine

## 2014-02-02 DIAGNOSIS — E039 Hypothyroidism, unspecified: Secondary | ICD-10-CM

## 2014-02-09 ENCOUNTER — Other Ambulatory Visit: Payer: Self-pay | Admitting: Internal Medicine

## 2014-02-09 NOTE — Telephone Encounter (Signed)
Ok X1 

## 2014-02-09 NOTE — Telephone Encounter (Signed)
Med last filled 01/11/14 Last office visit 02/01/14

## 2014-02-12 ENCOUNTER — Telehealth: Payer: Self-pay | Admitting: Internal Medicine

## 2014-02-12 ENCOUNTER — Encounter: Payer: Self-pay | Admitting: Internal Medicine

## 2014-02-12 NOTE — Telephone Encounter (Signed)
Most likely cause is neuropathy. Medicine's 1/2 life should not be 12 hrs Try 1/2 pill next time dose taken

## 2014-02-12 NOTE — Telephone Encounter (Signed)
Phone call to patient. She states she has been taking Tramadol for several months and has had no problems until this morning. She states she woke up and both legs and feet felt as if they were burning. Her last Tramadol was around 7 p.m. Last night. She read the side effects of Tramadol and it was mentioned that this could be a side effect. Please advise.

## 2014-02-12 NOTE — Telephone Encounter (Signed)
Patient believes she having side effects from tramadol.  Will send to nurse line.  Please give patient call back in regards.

## 2014-02-12 NOTE — Telephone Encounter (Signed)
Patient has been advised

## 2014-02-13 ENCOUNTER — Other Ambulatory Visit: Payer: Self-pay | Admitting: Internal Medicine

## 2014-02-13 ENCOUNTER — Other Ambulatory Visit: Payer: Self-pay

## 2014-02-13 DIAGNOSIS — E039 Hypothyroidism, unspecified: Secondary | ICD-10-CM

## 2014-02-13 DIAGNOSIS — I739 Peripheral vascular disease, unspecified: Secondary | ICD-10-CM | POA: Insufficient documentation

## 2014-02-13 MED ORDER — AMLODIPINE BESYLATE 10 MG PO TABS
10.0000 mg | ORAL_TABLET | Freq: Every day | ORAL | Status: DC
Start: 2014-02-13 — End: 2014-06-15

## 2014-02-13 MED ORDER — LEVOTHYROXINE SODIUM 75 MCG PO TABS: 75.0000 ug | ORAL_TABLET | Freq: Every day | ORAL | Status: DC

## 2014-02-15 ENCOUNTER — Encounter: Payer: Self-pay | Admitting: Internal Medicine

## 2014-02-15 ENCOUNTER — Ambulatory Visit (INDEPENDENT_AMBULATORY_CARE_PROVIDER_SITE_OTHER): Payer: Medicare Other | Admitting: Internal Medicine

## 2014-02-15 VITALS — BP 118/62 | HR 68 | Temp 97.5°F | Resp 16 | Ht 63.0 in | Wt 146.0 lb

## 2014-02-15 DIAGNOSIS — S81802D Unspecified open wound, left lower leg, subsequent encounter: Secondary | ICD-10-CM

## 2014-02-15 DIAGNOSIS — S81809A Unspecified open wound, unspecified lower leg, initial encounter: Secondary | ICD-10-CM

## 2014-02-15 DIAGNOSIS — M109 Gout, unspecified: Secondary | ICD-10-CM

## 2014-02-15 DIAGNOSIS — Z5189 Encounter for other specified aftercare: Secondary | ICD-10-CM

## 2014-02-15 MED ORDER — COLCHICINE 0.6 MG PO TABS: 0.3000 mg | ORAL_TABLET | Freq: Every day | ORAL | Status: DC

## 2014-02-15 NOTE — Patient Instructions (Signed)
We will send in some medicine for your gout. Take 1/2 pill per day for the next week.   Come back to see Dr. Alwyn Ren next week to look at your toe.

## 2014-02-15 NOTE — Progress Notes (Signed)
Pre visit review using our clinic review tool, if applicable. No additional management support is needed unless otherwise documented below in the visit note. 

## 2014-02-16 DIAGNOSIS — S81802A Unspecified open wound, left lower leg, initial encounter: Secondary | ICD-10-CM | POA: Insufficient documentation

## 2014-02-16 NOTE — Assessment & Plan Note (Signed)
Would appear to be gout flare on the right foot and will not give NSAIDs given renal insufficiency but will give colchicine 0.3 mg daily (dose adjusted for renal function). She will follow up in 1 week.

## 2014-02-16 NOTE — Assessment & Plan Note (Signed)
The blister on medial side of shin appears to be healing, advised vaseline with bandage for coverage and non-stick. Redness on the shin per previous notes does not appear to be changed and no calf tenderness. Will not give antibiotics as no change and no response to prior antibiotics. She will follow up with PCP in 1 week to ensure healing. Given her poor circulation may have recurrent issues with wounds.

## 2014-02-16 NOTE — Progress Notes (Signed)
   Subjective:    [name], female    DOB: Apr 12, 1917, 80 y.o.   MRN: [id number]  HPI The patient is a 80 YO woman who is coming in today with a blister on the side of her left shin that is healing which popped last week and one on the top of her left foot which is still intact. She is also having some gout in her right foot. She denies fevers or chills. She has redness on her left shin which she states has been there for some time without change despite 2 courses of antibiotics. She saw her doctor last week for same.   Review of Systems  Constitutional: Negative for fever and chills.  Respiratory: Negative for chest tightness.   Cardiovascular: Negative for chest pain.  Gastrointestinal: Negative for diarrhea and constipation.  Musculoskeletal: Positive for arthralgias and gait problem.  Skin: Positive for color change and wound.       Objective:   Physical Exam  Constitutional: She appears well-developed and well-nourished.  HENT:  Head: Normocephalic and atraumatic.  Cardiovascular: Normal rate and regular rhythm.   Pulmonary/Chest: Effort normal and breath sounds normal.  Abdominal: Soft. Bowel sounds are normal.  Neurological: Coordination abnormal.  Not walking well with both feet injured and in wheelchair  Skin: Skin is warm and dry.          Assessment & Plan:

## 2014-02-22 ENCOUNTER — Encounter: Payer: Self-pay | Admitting: Internal Medicine

## 2014-02-22 ENCOUNTER — Other Ambulatory Visit (INDEPENDENT_AMBULATORY_CARE_PROVIDER_SITE_OTHER): Payer: Medicare Other

## 2014-02-22 ENCOUNTER — Ambulatory Visit (INDEPENDENT_AMBULATORY_CARE_PROVIDER_SITE_OTHER): Payer: Medicare Other | Admitting: Internal Medicine

## 2014-02-22 VITALS — BP 130/52 | HR 67 | Temp 97.8°F | Wt 148.5 lb

## 2014-02-22 DIAGNOSIS — R238 Other skin changes: Secondary | ICD-10-CM

## 2014-02-22 DIAGNOSIS — E039 Hypothyroidism, unspecified: Secondary | ICD-10-CM

## 2014-02-22 DIAGNOSIS — N183 Chronic kidney disease, stage 3 unspecified: Secondary | ICD-10-CM

## 2014-02-22 DIAGNOSIS — M109 Gout, unspecified: Secondary | ICD-10-CM

## 2014-02-22 DIAGNOSIS — Z23 Encounter for immunization: Secondary | ICD-10-CM

## 2014-02-22 DIAGNOSIS — I739 Peripheral vascular disease, unspecified: Secondary | ICD-10-CM

## 2014-02-22 DIAGNOSIS — M1000 Idiopathic gout, unspecified site: Secondary | ICD-10-CM

## 2014-02-22 NOTE — Progress Notes (Signed)
Pre visit review using our clinic review tool, if applicable. No additional management support is needed unless otherwise documented below in the visit note. 

## 2014-02-22 NOTE — Progress Notes (Signed)
   Subjective:    [name], female    DOB: March 05, 1917, 80 y.o.   MRN: [id number]  HPI She presents today for follow up of gout in L great toe and blisters on L shin and dorsum of L foot.  She states the pain in her L toe is now gone one week after initiating colchicine 0.3 mg daily.  She denies any GI upset, abdominal pain or cramping, diarrhea, nausea or vomiting since starting this medication.  L shin has redness and edema.  The blister on her L shin has popped and is no longer draining.  The blister on her L foot began leaking today.  She has a referral to the vascular specialist, and the visit has been scheduled for 03/23/2014.  Review of Systems She denies fever, chills, sweats, purulent discharge from blisters, change in gait.  REVIEW OF SYSTEMS See above.    Objective:   Physical Exam        Assessment & Plan:

## 2014-02-22 NOTE — Patient Instructions (Signed)
Your next office appointment will be determined based upon review of your pending labs. Those instructions will be transmitted to you through My Chart .   Dip gauze in  sterile saline and applied to the wound twice a day. Cover the wound with Telfa , non stick dressing  without any antibiotic ointment. The saline can be purchased at the drugstore or you can make your own .Boil cup of salt in a gallon of water. Store mixture  in a clean container.Report Warning  signs as discussed (red streaks, pus, fever, increasing pain).

## 2014-02-22 NOTE — Progress Notes (Signed)
   Subjective:    [name], female    DOB: January 13, 1917, 80 y.o.   MRN: [id number]  HPI   Gout symptoms in the right great toe have improved since 02/15/14 . She's been on the colchicine 0.3 since that visit.  She has 2 blisters on the left lower extremity; the superior blister has ruptured. She been applying Vaseline to this.  A larger blister distally has begun leaking.  She is set to see the vascular specialist 03/23/14  Her Nephrologist had recommended considering stenting renal artery; she felt she was " too old"to pursue this.     Review of Systems    She has no associated fever, chills, sweats, purulence with the blisters.  She is not had cramps or abdominal pain with the colchicine. Also she denies nausea, vomiting, or diarrhea      Objective:   Physical Exam   The superior blister is flattened with residual skin tissue in an  irregular circumferential distribution. It measures 2.5 x 2 cm.   The larger blister measures 4 x 3.5 cm.  She does have erythema over the left shin which is variable. Initially it was quite faint but with elevation of the leg and manipulation of lower extremity it became more erythematous.  She has no palpable pulses in her feet.  There is erythema of the base of the right great toe w/o significant tenderness. There is blanching with pressure        Assessment & Plan:  #1 severe peripheral vascular disease  #2 gout  #3 blisters without evidence of infection  Plan: ABIs will be ordered to evaluate the peripheral vascular circulation.  Wet-to-dry dressings for the open blister. Warning signs of infection were discussed  Uric acid and BMET will be drawn as she's been on the colchicine.

## 2014-02-23 ENCOUNTER — Telehealth: Payer: Self-pay

## 2014-02-23 LAB — BASIC METABOLIC PANEL
BUN: 44 mg/dL — AB (ref 6–23)
CHLORIDE: 101 meq/L (ref 96–112)
CO2: 31 mEq/L (ref 19–32)
CREATININE: 2.5 mg/dL — AB (ref 0.4–1.2)
Calcium: 9.6 mg/dL (ref 8.4–10.5)
GFR: 19.2 mL/min — AB (ref >60.00)
Glucose, Bld: 108 mg/dL — ABNORMAL HIGH (ref 70–99)
Potassium: 4.5 mEq/L (ref 3.5–5.1)
Sodium: 139 mEq/L (ref 135–145)

## 2014-02-23 LAB — TSH: TSH: 3.68 u[IU]/mL (ref 0.35–4.50)

## 2014-02-23 LAB — URIC ACID: Uric Acid, Serum: 11.8 mg/dL — ABNORMAL HIGH (ref 2.4–7.0)

## 2014-02-23 NOTE — Telephone Encounter (Signed)
10.1.15 office note has been faxed to Dr Arrie Aranoladonato at [phone]

## 2014-02-23 NOTE — Telephone Encounter (Signed)
Message copied by Noreene LarssonANDREWS, Jacynda Brunke R on Fri Feb 23, 2014 10:59 AM ------      Message from: Pecola LawlessHOPPER, WILLIAM F      Created: Fri Feb 23, 2014 10:47 AM       Please FAX office visit  to Dr  Arrie Aranoladonato, Nephrology ------

## 2014-03-01 ENCOUNTER — Encounter (HOSPITAL_COMMUNITY): Payer: Medicare Other

## 2014-03-05 ENCOUNTER — Telehealth: Payer: Self-pay | Admitting: Internal Medicine

## 2014-03-07 ENCOUNTER — Other Ambulatory Visit: Payer: Self-pay | Admitting: *Deleted

## 2014-03-07 ENCOUNTER — Encounter: Payer: Self-pay | Admitting: Vascular Surgery

## 2014-03-07 DIAGNOSIS — N289 Disorder of kidney and ureter, unspecified: Secondary | ICD-10-CM

## 2014-03-07 DIAGNOSIS — R609 Edema, unspecified: Secondary | ICD-10-CM

## 2014-03-13 ENCOUNTER — Other Ambulatory Visit: Payer: Self-pay | Admitting: Internal Medicine

## 2014-03-14 NOTE — Telephone Encounter (Signed)
Tramadol has been called to pharmacy  

## 2014-03-14 NOTE — Telephone Encounter (Signed)
10.1.15 last office visit  9.18.15 med last phoned to pharmacy

## 2014-03-14 NOTE — Telephone Encounter (Signed)
OK #30 

## 2014-03-22 ENCOUNTER — Encounter: Payer: Self-pay | Admitting: Vascular Surgery

## 2014-03-23 ENCOUNTER — Ambulatory Visit (INDEPENDENT_AMBULATORY_CARE_PROVIDER_SITE_OTHER)
Admission: RE | Admit: 2014-03-23 | Discharge: 2014-03-23 | Disposition: A | Discharge status: Home / Self Care | Payer: Medicare Other | Source: Ambulatory Visit | Attending: Vascular Surgery | Admitting: Vascular Surgery

## 2014-03-23 ENCOUNTER — Ambulatory Visit (INDEPENDENT_AMBULATORY_CARE_PROVIDER_SITE_OTHER): Payer: Medicare Other | Admitting: Vascular Surgery

## 2014-03-23 ENCOUNTER — Encounter: Payer: Self-pay | Admitting: Vascular Surgery

## 2014-03-23 ENCOUNTER — Ambulatory Visit (HOSPITAL_COMMUNITY)
Admission: RE | Admit: 2014-03-23 | Discharge: 2014-03-23 | Disposition: A | Discharge status: Home / Self Care | Payer: Medicare Other | Source: Ambulatory Visit | Attending: Vascular Surgery | Admitting: Vascular Surgery

## 2014-03-23 VITALS — BP 164/62 | HR 89 | Resp 14 | Ht 63.5 in | Wt 144.0 lb

## 2014-03-23 DIAGNOSIS — I872 Venous insufficiency (chronic) (peripheral): Secondary | ICD-10-CM

## 2014-03-23 DIAGNOSIS — R609 Edema, unspecified: Secondary | ICD-10-CM

## 2014-03-23 DIAGNOSIS — N289 Disorder of kidney and ureter, unspecified: Secondary | ICD-10-CM

## 2014-03-23 DIAGNOSIS — I779 Disorder of arteries and arterioles, unspecified: Secondary | ICD-10-CM

## 2014-03-23 NOTE — Progress Notes (Signed)
Referred by:  Pecola LawlessWilliam F Hopper, MD [address] [place], [place] [id number]  Reason for referral: PVD  History of Present Illness  Heather Morrison is a 80 y.o. (17-Jan-1917) female who presents with chief complaint: swelling of left leg and blisters. She has had swelling of her left leg for the past three months with erythema. She has completed two courses of antibiotics for possible cellulitis without improvement. One month ago, she developed a blister on the dorsum of her left foot and right inferior shin. The blister on the shin has since healed. The blister on the left dorsum has remained the same. She has been washing with soap and water. She denies any pain with her left leg. She ambulates with a walker due pain with her left hip.   She has a past medical history of left lower extremity vein stripping. She has has renal artery stenosis with a creatinine of 2.19 and GFR of 18. She has hypertension managed with a CCB and ARB.   She denies any rest pain, intermittent claudication and prior history of non-healing wounds.   Past Medical History  Diagnosis Date  . Coronary atherosclerosis of native coronary artery 2008    ACS; Dr Excell Seltzerooper  . Atherosclerosis of renal artery   . Essential hypertension, benign   . Pure hypercholesterolemia   . Carotid bruit 1998    negative Doppler  . Renal artery stenosis     Past Surgical History  Procedure Laterality Date  . Varicose vein surgery  1978  . Partial hip arthroplasty  2004  . Cataract extraction    . Total abdominal hysterectomy with uso      Dr Shea Evansunn, for fibroids  . Cardiac catherization  2008    cardiac cath and abdominal aortogram performed 08/30/2006    History   Social History  . Marital Status: Married    Spouse Name: N/A    Number of Children: N/A  . Years of Education: N/A   Occupational History  . Not on file.   Social History Main Topics  . Smoking status: Never Smoker   . Smokeless tobacco: Never Used  . Alcohol  Use: No  . Drug Use: No  . Sexual Activity: Not on file   Other Topics Concern  . Not on file   Social History Narrative  . No narrative on file    Family History  Problem Relation Age of Onset  . Hypertension Father   . Stroke Father     in 7070s  . Breast cancer Daughter   . Diabetes Neg Hx   . Heart disease Neg Hx     Current Outpatient Prescriptions on File Prior to Visit  Medication Sig Dispense Refill  . amLODipine (NORVASC) 10 MG tablet Take 1 tablet (10 mg total) by mouth daily.  90 tablet  1  . Calcium-Vitamin D (CALTRATE 600 PLUS-VIT D PO) Take 1 tablet by mouth every evening.       . furosemide (LASIX) 40 MG tablet Take 1 tablet (40 mg total) by mouth daily.  90 tablet  3  . levothyroxine (SYNTHROID, LEVOTHROID) 75 MCG tablet Take 1 tablet (75 mcg total) by mouth daily.  90 tablet  1  . losartan (COZAAR) 100 MG tablet Take 100 mg by mouth daily with breakfast.      . Multiple Vitamin (MULTIVITAMIN) capsule Take 1 capsule by mouth every evening.       . traMADol (ULTRAM) 50 MG tablet TAKE ONE-HALF  TO ONE TABLET BY MOUTH EVERY 8 HOURS AS NEEDED  30 tablet  0  . white petrolatum (VASELINE) GEL Apply 1 application topically as needed for dry skin (for arms and legs).      . colchicine 0.6 MG tablet Take 0.5 tablets (0.3 mg total) by mouth daily.  10 tablet  0  . doxycycline (VIBRA-TABS) 100 MG tablet Take 1 tablet (100 mg total) by mouth 2 (two) times daily.  10 tablet  0   No current facility-administered medications on file prior to visit.    No Known Allergies  REVIEW OF SYSTEMS:  (Positives checked otherwise negative)  CARDIOVASCULAR:  [ ]  chest pain, [ ]  chest pressure, [ ]  palpitations, [ ]  shortness of breath when laying flat, [ ]  shortness of breath with exertion,   [ ]  pain in feet when walking, [ ]  pain in feet when laying flat, [ ]  history of blood clot in veins (DVT), [x ] history of phlebitis, [x ] swelling in legs, [x ] varicose veins  PULMONARY:  [ ]   productive cough, [ ]  asthma, [ ]  wheezing  NEUROLOGIC:  [ ]  weakness in arms or legs, [ ]  numbness in arms or legs, [ ]  difficulty speaking or slurred speech, [ ]  temporary loss of vision in one eye, [ ]  dizziness  HEMATOLOGIC:  [ ]  bleeding problems, [ ]  problems with blood clotting too easily  MUSCULOSKEL:  [ ]  joint pain, [ ]  joint swelling  GASTROINTEST:  [ ]   Vomiting blood, [ ]   Blood in stool     GENITOURINARY:  [ ]   Burning with urination, [ ]   Blood in urine  PSYCHIATRIC:  [ ]  history of major depression  INTEGUMENTARY:  [ ]  rashes, [ ]  ulcers  CONSTITUTIONAL:  [ ]  fever, [ ]  chills  For VQI Use Only  PRE-ADM LIVING: Nursing home  AMB STATUS: Ambulatory with Assistance  CAD Sx: None  PRIOR CHF: None  STRESS TEST: [ ]  No, [ ]  Normal, [ ]  + ischemia, [ ]  + MI, [ ]  Both   Physical Examination Filed Vitals:   03/23/14 1524  BP: 164/62  Pulse: 89  Resp: 14  Height: 5' 3.5" (1.613 m)  Weight: 144 lb (65.318 kg)   Body mass index is 25.11 kg/(m^2).  General: A&O x 3, WDWN elderly female in NAD  Head: [place]/AT  Ear/Nose/Throat: Hearing grossly intact, nares w/o erythema or drainage  Neck: Supple, no nuchal rigidity, no palpable LAD  Pulmonary: Sym exp, good air movt, mild rales right lower lung field  Cardiac: RRR, Nl S1, S2, no Murmurs, rubs or gallops  Vascular: Vessel Right Left  Carotid Palpable, without bruit Palpable, without bruit  Aorta Not palpable N/A  Femoral Palpable Not Palpable  Popliteal Not palpable Not palpable  PT Not Palpable Not Palpable  DP Not Palpable Not Palpable   Gastrointestinal: soft, NTND, -G/R, - HSM, - masses  Musculoskeletal: rubor of left lower leg. 2 x 3 cm blister of left dorsum of foot. Hemosiderin staining of right shin. Multiple spider telangectasias and varicose veins bilaterally.   Neurologic: CN 2-12 intact. Pain and light touch intact in extremities.   Psychiatric: Judgment intact, Mood & affect  appropriatefor pt's clinical situation  Dermatologic: See M/S exam for extremity exam, no rashes otherwise noted  Lymph: no palpable inguinal or cervical LAD   Non-Invasive Vascular Imaging  ABI (Date: 03/23/2014)  R: .72; TBI: .27  L: .71; TBI: .21  Medical Decision Making  Heather Morrison is a 80 y.o. female who presents with mixed venous and arterial insufficiency. Her ABIs today suggest moderate arterial insufficiency. Her left lower extremity erythema is unlikely cellulitis but is likely rubor of dependency.  Given her age and desire for conservative treatment, she was advised to continue with local wound care. [facility]. Once her wound heals, she was advised to use compression stockings. If she has issues with wound healing, the next step would be to pursue angiography.   Maris BergerKimberly Trinh, PA-C [facility] [place] Office: [phone] Pager: [phone]  03/23/2014, 4:02 PM  Addendum  I have independently interviewed and examined the patient, and I agree with the physician assistant's findings.  Suspect her blistering is venous in nature given significant venous insufficiency in both legs.  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  She also has some degree of PAD in the left leg.  With her CKD, angiography is no recommended as it might make her ESRD.  At this point, wound care would be the best choice of intervention.  If this fails to resolve her wound, angiography might be considered.  At 80 y/o, she is not a open surgical candidate especially given her multiple comorbidities.  Leonides SakeBrian Chen, MD [facility] [place] Office: [phone] Pager: [phone]  03/23/2014, 5:13 PM

## 2014-03-29 ENCOUNTER — Encounter: Payer: Self-pay | Admitting: Vascular Surgery

## 2014-04-13 ENCOUNTER — Other Ambulatory Visit: Payer: Self-pay | Admitting: Internal Medicine

## 2014-04-13 NOTE — Telephone Encounter (Signed)
Tramadol has been called to [facility] pharmacy  

## 2014-05-07 ENCOUNTER — Encounter: Payer: Self-pay | Admitting: Internal Medicine

## 2014-05-07 ENCOUNTER — Telehealth: Payer: Self-pay | Admitting: *Deleted

## 2014-05-07 NOTE — Telephone Encounter (Signed)
ZOX:[id number]:Patients daughter called and just wanted to let her cardiologist know that Dr Arrie Aranoladonato stopped the patients losartan today due to her worsening kidney function.

## 2014-05-08 NOTE — Telephone Encounter (Signed)
thx

## 2014-05-14 ENCOUNTER — Other Ambulatory Visit: Payer: Self-pay | Admitting: Internal Medicine

## 2014-05-14 NOTE — Telephone Encounter (Signed)
[facility] [address]

## 2014-05-14 NOTE — Telephone Encounter (Signed)
OK X1 

## 2014-05-23 NOTE — Telephone Encounter (Signed)
error 

## 2014-06-13 ENCOUNTER — Other Ambulatory Visit: Payer: Self-pay | Admitting: Internal Medicine

## 2014-06-13 NOTE — Telephone Encounter (Signed)
[facility] [address]

## 2014-06-13 NOTE — Telephone Encounter (Signed)
OK X1 

## 2014-06-15 ENCOUNTER — Other Ambulatory Visit: Payer: Self-pay | Admitting: Internal Medicine

## 2014-06-18 ENCOUNTER — Other Ambulatory Visit: Payer: Self-pay

## 2014-06-18 DIAGNOSIS — E038 Other specified hypothyroidism: Secondary | ICD-10-CM

## 2014-06-18 MED ORDER — LEVOTHYROXINE SODIUM 75 MCG PO TABS: 75.0000 ug | ORAL_TABLET | Freq: Every day | ORAL | Status: DC

## 2014-06-20 ENCOUNTER — Ambulatory Visit (INDEPENDENT_AMBULATORY_CARE_PROVIDER_SITE_OTHER): Payer: Medicare Other | Admitting: Internal Medicine

## 2014-06-20 ENCOUNTER — Encounter: Payer: Self-pay | Admitting: Internal Medicine

## 2014-06-20 VITALS — BP 122/56 | HR 71 | Temp 98.2°F | Ht 64.0 in | Wt 143.5 lb

## 2014-06-20 DIAGNOSIS — J209 Acute bronchitis, unspecified: Secondary | ICD-10-CM

## 2014-06-20 MED ORDER — AMOXICILLIN 500 MG PO CAPS: 500.0000 mg | ORAL_CAPSULE | Freq: Three times a day (TID) | ORAL | Status: DC

## 2014-06-20 MED ORDER — HYDROCODONE-HOMATROPINE 5-1.5 MG/5ML PO SYRP: 5.0000 mL | ORAL_SOLUTION | Freq: Four times a day (QID) | ORAL | Status: DC | PRN

## 2014-06-20 NOTE — Patient Instructions (Signed)
Plain Mucinex (NOT D) for thick secretions ;force NON dairy fluids .   Flonase OR Nasacort AQ 1 spray in each nostril twice a day as needed for any sinus congestion. Use the "crossover" technique into opposite nostril spraying toward opposite ear @ 45 degree angle, not straight up into nostril.  Plain Allegra (NOT D )  160 daily , Loratidine 10 mg , OR Zyrtec 10 mg @ bedtime  as needed for itchy eyes & sneezing.

## 2014-06-20 NOTE — Progress Notes (Signed)
   Subjective:    [name], female    DOB: 1917-05-18, 81 y.o.   MRN: [id number]  HPI Symptoms began 2 days ago as a cough with clear sputum. She states she coughed all night unable to sleep.  Yesterday she experienced anorexia ,able to eat only a little soup  The cough has progressed with production of  yellow sputum. Her temperature has been up to 101.8.  She has had watery eyes and sneezing.   She has no evidence of recurrent cellulitis of the left shin as has been noted in the past.   Review of Systems Frontal headache, facial pain , nasal purulence, dental pain, sore throat , otic pain or otic discharge denied. No chills or sweats.  There is no wheezing, dyspnea  or  paroxysmal nocturnal dyspnea associated with the cough.     Objective:   Physical Exam Positive or pertinent findings: Appears younger than stated age.  She has bilateral hearing aids. There is erythema of the nasal mucosa & septum. She is hoarse and exhibits some hyponasal speech. She has marked mixed PIP/DIP and DIP joint changes.  General appearance:Adequately nourished; no acute distress or increased work of breathing is present.  No  lymphadenopathy about the head, neck, or axilla noted.  Eyes: No conjunctival inflammation or lid edema is present. There is no scleral icterus. Ears:  External ear exam shows no significant lesions or deformities.  Otoscopic examination reveals clear canals, tympanic membranes are intact bilaterally without bulging, retraction, inflammation or discharge. Nose:  External nasal examination shows no deformity or inflammation.  No septal dislocation or deviation.No obstruction to airflow.  Oral exam: Dental hygiene is good; lips and gums are healthy appearing.There is no oropharyngeal erythema or exudate noted.  Neck:  No thyromegaly, masses, or tenderness noted.  Subtle torticollis Heart:  Normal rate and regular rhythm. S1 and S2 normal without gallop, murmur,  click, rub or other extra sounds.  Lungs:Chest clear to auscultation; no wheezes, rhonchi,rales ,or rubs present. Extremities:  No cyanosis, edema, or clubbing  noted  Skin: Warm & dry w/o jaundice or tenting.       Assessment & Plan:  #1 acute bronchitis w/o bronchospasm  Plan: See orders and recommendations

## 2014-06-20 NOTE — Progress Notes (Signed)
Pre visit review using our clinic review tool, if applicable. No additional management support is needed unless otherwise documented below in the visit note. 

## 2014-06-29 ENCOUNTER — Telehealth: Payer: Self-pay | Admitting: Internal Medicine

## 2014-06-29 NOTE — Telephone Encounter (Signed)
Pt called stated [facility] need approval from Dr. Alwyn RenHopper for amoxicillin (AMOXIL) 500 MG capsule . Please check

## 2014-06-29 NOTE — Telephone Encounter (Signed)
Do you want patient to have this?

## 2014-06-29 NOTE — Telephone Encounter (Signed)
Not a maintenance med.

## 2014-06-29 NOTE — Telephone Encounter (Signed)
Phone call to patient and she states she got the Amoxicillin through her local pharmacy.   I had to call [facility] and cancel the order through them. Patient does not need this to go through the mail order.

## 2014-07-16 ENCOUNTER — Other Ambulatory Visit: Payer: Self-pay | Admitting: Internal Medicine

## 2014-07-16 NOTE — Telephone Encounter (Signed)
OK X1 

## 2014-07-16 NOTE — Telephone Encounter (Signed)
[facility]

## 2014-08-18 ENCOUNTER — Other Ambulatory Visit: Payer: Self-pay | Admitting: Internal Medicine

## 2014-08-20 NOTE — Telephone Encounter (Signed)
Tramadol has been called to [facility] pharmacy  

## 2014-08-20 NOTE — Telephone Encounter (Signed)
OK X1 

## 2014-09-13 ENCOUNTER — Other Ambulatory Visit: Payer: Self-pay

## 2014-09-13 MED ORDER — TRAMADOL HCL 50 MG PO TABS: ORAL_TABLET | ORAL | Status: DC

## 2014-09-13 NOTE — Telephone Encounter (Signed)
Tramadol has been called to [facility]  

## 2014-09-13 NOTE — Telephone Encounter (Signed)
Ok X1 

## 2014-10-08 ENCOUNTER — Other Ambulatory Visit: Payer: Self-pay | Admitting: Internal Medicine

## 2014-10-08 ENCOUNTER — Other Ambulatory Visit: Payer: Self-pay

## 2014-10-08 ENCOUNTER — Telehealth: Payer: Self-pay | Admitting: Internal Medicine

## 2014-10-08 DIAGNOSIS — E038 Other specified hypothyroidism: Secondary | ICD-10-CM

## 2014-10-08 DIAGNOSIS — E039 Hypothyroidism, unspecified: Secondary | ICD-10-CM

## 2014-10-08 MED ORDER — LEVOTHYROXINE SODIUM 75 MCG PO TABS: 75.0000 ug | ORAL_TABLET | Freq: Every day | ORAL | Status: DC

## 2014-10-08 NOTE — Telephone Encounter (Signed)
Pt called in said that Hop increased levothyroxine (SYNTHROID, LEVOTHROID) 75 MCG tablet [[id number]][[id number]] to 1 1/2 tabs a day instead of 1 tab.  Now she has ran ou early.  Can a new script be sent to [facility] for 1 1/2 tabs daily?    Also she needs  [facility] [address] to hold her until she can get the new script from [facility]     Best number [phone]

## 2014-10-08 NOTE — Telephone Encounter (Signed)
Send #30 please but she needs TSH; last done 10/15 Order in

## 2014-10-08 NOTE — Telephone Encounter (Signed)
Advised patient i have called in [facility] supply to [facility], but patient needs to make appt before any more refills, patient stated she will call back to schedule appt after she checks with her daughter to see when she can bring her for appt

## 2014-10-15 ENCOUNTER — Other Ambulatory Visit: Payer: Self-pay | Admitting: Internal Medicine

## 2014-10-15 NOTE — Telephone Encounter (Signed)
Tramadol OK

## 2014-10-15 NOTE — Telephone Encounter (Signed)
OK X1 

## 2014-10-15 NOTE — Telephone Encounter (Signed)
rx for tramadol sent

## 2014-10-15 NOTE — Telephone Encounter (Signed)
Please advise, thanks.

## 2014-10-25 ENCOUNTER — Encounter: Payer: Self-pay | Admitting: Internal Medicine

## 2014-10-25 ENCOUNTER — Ambulatory Visit (INDEPENDENT_AMBULATORY_CARE_PROVIDER_SITE_OTHER): Payer: Medicare Other | Admitting: Internal Medicine

## 2014-10-25 ENCOUNTER — Other Ambulatory Visit: Payer: Self-pay | Admitting: Internal Medicine

## 2014-10-25 ENCOUNTER — Other Ambulatory Visit (INDEPENDENT_AMBULATORY_CARE_PROVIDER_SITE_OTHER): Payer: Medicare Other

## 2014-10-25 VITALS — BP 142/58 | HR 62 | Temp 97.6°F | Wt 144.0 lb

## 2014-10-25 DIAGNOSIS — E039 Hypothyroidism, unspecified: Secondary | ICD-10-CM | POA: Diagnosis not present

## 2014-10-25 DIAGNOSIS — H6121 Impacted cerumen, right ear: Secondary | ICD-10-CM

## 2014-10-25 DIAGNOSIS — L309 Dermatitis, unspecified: Secondary | ICD-10-CM

## 2014-10-25 LAB — TSH: TSH: 0.73 u[IU]/mL (ref 0.35–4.50)

## 2014-10-25 MED ORDER — LEVOTHYROXINE SODIUM 112 MCG PO TABS: 112.0000 ug | ORAL_TABLET | Freq: Every day | ORAL | Status: DC

## 2014-10-25 MED ORDER — TRIAMCINOLONE ACETONIDE 0.5 % EX OINT: TOPICAL_OINTMENT | CUTANEOUS | Status: DC

## 2014-10-25 NOTE — Patient Instructions (Addendum)
Please take Zyrtec at bedtime if you have any itching. Please use hypoallergenic detergents such as Dreft or RwandaIvory Flakes to wash clothes. Avoid perfumes and cosmetics which are not hypoallergenic. Restrict hyperallergenic foods at this time: Nuts, strawberries, seafood , chocolate, and tomatoes. Thyroid 75 g 1& 1/2 daily; TSH will be drawn to see if any change needs to be made

## 2014-10-25 NOTE — Progress Notes (Signed)
Pre visit review using our clinic review tool, if applicable. No additional management support is needed unless otherwise documented below in the visit note. 

## 2014-10-25 NOTE — Progress Notes (Signed)
   Subjective:    [name], female    DOB: 1917-03-12, 81 y.o.   MRN: [id number]  HPI She was told that she had a cerumen impaction on the right when she had her regular hearing aid evaluation. They wanted this removed prior to adjusting the hearing aid.  She's concerned about rash on her back; she had seen a Dermatologist, Dr. Jorja Loaafeen but no treatment was recommended She describes some burning and itching of subscapular areas which is constant. This is been present for 3 weeks.  She is also due for refill of her thyroid medication. The last TSH on record was 3.68 on 02/22/14.   Review of Systems  No associated itchy, watery eyes.  Swelling of the lips or tongue or intraoral lesions denied.  Shortness of breath, wheezing, or cough absent.  No vesicles, pustules or urticaria noted.  Fever ,chills , or sweats denied.   Diarrhea not present.  No dysuria, pyuria or hematuria.     Objective:   Physical Exam  Pertinent/positive findings include: She is markedly hard of hearing. There is some cerumen on the right but it's not impacted clinically. She's wearing hearing aid on the left.  She has a grade 1/2-1 systolic murmur.  There is a diffuse irregular faint erythematous rash over the back over the lower thorax. There is no associated urticarial lesions, vesicles, or pustules. She has multiple skin lesions which have been treated with laser. No significant dermatographia can be elicited  She has trace edema. Pulses are decreased.   General appearance :adequately nourished; in no distress. Eyes: No conjunctival inflammation or scleral icterus is present. Oral exam:  Lips and gums are healthy appearing.There is no oropharyngeal erythema or exudate noted.  Heart:  Normal rate and regular rhythm. S1 and S2 normal without gallop, click, rub or other extra sounds   Lungs:Chest clear to auscultation; no wheezes, rhonchi,rales ,or rubs present.No increased work of  breathing. Abdomen: bowel sounds normal, soft and non-tender without masses, organomegaly or hernias noted.  No guarding or rebound.  Vascular : all pulses equal ; no bruits present.  Lymphatic: No lymphadenopathy is noted about the head, neck, axilla.  Neuro: Strength, tone decreased.        Assessment & Plan:  #1 cerumen right otic canal; gavage and removal be accomplished  #2. Faint pruritic rash over the posterior thorax  #3 hypothyroidism  Plan: See orders/recommendations

## 2014-11-15 ENCOUNTER — Other Ambulatory Visit: Payer: Self-pay | Admitting: Internal Medicine

## 2014-11-16 ENCOUNTER — Other Ambulatory Visit: Payer: Self-pay | Admitting: Emergency Medicine

## 2014-11-16 MED ORDER — TRAMADOL HCL 50 MG PO TABS: ORAL_TABLET | ORAL | Status: DC

## 2014-11-16 NOTE — Telephone Encounter (Signed)
Please advise. Last OV 06/16

## 2014-11-16 NOTE — Telephone Encounter (Signed)
OK X1  My retirement date is 05/25/2015; but I will be in office only T, Weds & Thurs during the months Oct-Dec.You should transition your care to another PCP by Oct 1,2016.   

## 2014-11-19 ENCOUNTER — Encounter: Payer: Self-pay | Admitting: Internal Medicine

## 2014-11-19 ENCOUNTER — Other Ambulatory Visit: Payer: Self-pay

## 2014-11-22 NOTE — Telephone Encounter (Signed)
Please advise 

## 2014-12-07 ENCOUNTER — Telehealth: Payer: Self-pay | Admitting: Internal Medicine

## 2014-12-07 NOTE — Telephone Encounter (Signed)
Patient went to the eye doctor because of double vision. He did all the tests he could and he wanted her to get some blood work done. Will you be able to order the labs for this? Does she need an appointment? Patient's daughter has a letter from the eye doctor. Would you like her to fax that in? Please advise

## 2014-12-07 NOTE — Telephone Encounter (Signed)
Pt is going to fax over letter from doctor of all the lab work that needs to be done.

## 2014-12-10 ENCOUNTER — Other Ambulatory Visit: Payer: Self-pay | Admitting: Internal Medicine

## 2014-12-11 ENCOUNTER — Other Ambulatory Visit: Payer: Self-pay

## 2014-12-11 ENCOUNTER — Other Ambulatory Visit: Payer: Self-pay | Admitting: Emergency Medicine

## 2014-12-11 DIAGNOSIS — E039 Hypothyroidism, unspecified: Secondary | ICD-10-CM

## 2014-12-11 MED ORDER — TRAMADOL HCL 50 MG PO TABS: ORAL_TABLET | ORAL | Status: DC

## 2014-12-11 MED ORDER — LEVOTHYROXINE SODIUM 112 MCG PO TABS: 112.0000 ug | ORAL_TABLET | Freq: Every day | ORAL | Status: DC

## 2014-12-11 NOTE — Telephone Encounter (Signed)
OK X1  My retirement date is 05/25/2015; but I will be in office on a limited schedule Oct-Dec. To guarantee continuity of care you should transition your care to another PCP by Oct 1,2016.     

## 2014-12-11 NOTE — Telephone Encounter (Signed)
Please advise, thanks.

## 2014-12-11 NOTE — Telephone Encounter (Signed)
rx for [facility]adol [facility]ed [facility]

## 2014-12-12 NOTE — Telephone Encounter (Signed)
Pt daughter called in and and wanted to know if you ever rec'd this fax?    Daughter cell number [phone] Heather BridgeMartha

## 2014-12-14 NOTE — Telephone Encounter (Signed)
Spoke with daughter and she is going to resend the fax from pts eye dr appt.

## 2014-12-15 ENCOUNTER — Encounter: Payer: Self-pay | Admitting: Internal Medicine

## 2014-12-15 ENCOUNTER — Other Ambulatory Visit: Payer: Self-pay | Admitting: Internal Medicine

## 2014-12-15 DIAGNOSIS — H532 Diplopia: Secondary | ICD-10-CM

## 2014-12-17 ENCOUNTER — Telehealth: Payer: Self-pay | Admitting: Emergency Medicine

## 2014-12-17 NOTE — Telephone Encounter (Signed)
Patients daughter called back in.  I gave her response.  Daughter is going to try to bring patient tomorrow afternoon.  I told patient once labs came back we would call her to schedule appt.

## 2014-12-17 NOTE — Telephone Encounter (Signed)
LVM for daughter to call back, per Dr Alwyn Ren, Pt needs FASTING labs to evaluate double vision and will need an OV once labs are back.

## 2014-12-19 ENCOUNTER — Other Ambulatory Visit: Payer: Self-pay | Admitting: Internal Medicine

## 2014-12-19 ENCOUNTER — Telehealth: Payer: Self-pay

## 2014-12-19 ENCOUNTER — Other Ambulatory Visit (INDEPENDENT_AMBULATORY_CARE_PROVIDER_SITE_OTHER): Payer: Medicare Other

## 2014-12-19 ENCOUNTER — Ambulatory Visit (INDEPENDENT_AMBULATORY_CARE_PROVIDER_SITE_OTHER): Payer: Medicare Other

## 2014-12-19 DIAGNOSIS — Z23 Encounter for immunization: Secondary | ICD-10-CM

## 2014-12-19 DIAGNOSIS — H532 Diplopia: Secondary | ICD-10-CM | POA: Diagnosis not present

## 2014-12-19 LAB — CBC WITH DIFFERENTIAL/PLATELET
Basophils Absolute: 0 10*3/uL (ref 0.0–0.1)
Basophils Relative: 0.4 % (ref 0.0–3.0)
EOS ABS: 0.2 10*3/uL (ref 0.0–0.7)
Eosinophils Relative: 2.6 % (ref 0.0–5.0)
HCT: 37.6 % (ref 36.0–46.0)
Hemoglobin: 12.5 g/dL (ref 12.0–15.0)
LYMPHS ABS: 2.2 10*3/uL (ref 0.7–4.0)
Lymphocytes Relative: 29.1 % (ref 12.0–46.0)
MCHC: 33.4 g/dL (ref 30.0–36.0)
MCV: 85.6 fl (ref 78.0–100.0)
Monocytes Absolute: 0.6 10*3/uL (ref 0.1–1.0)
Monocytes Relative: 8 % (ref 3.0–12.0)
NEUTROS ABS: 4.5 10*3/uL (ref 1.4–7.7)
NEUTROS PCT: 59.9 % (ref 43.0–77.0)
PLATELETS: 277 10*3/uL (ref 150.0–400.0)
RBC: 4.39 Mil/uL (ref 3.87–5.11)
RDW: 13.9 % (ref 11.5–15.5)
WBC: 7.5 10*3/uL (ref 4.0–10.5)

## 2014-12-19 LAB — SEDIMENTATION RATE: Sed Rate: 45 mm/hr — ABNORMAL HIGH (ref 0–22)

## 2014-12-19 LAB — TSH: TSH: 0.22 u[IU]/mL — AB (ref 0.35–4.50)

## 2014-12-19 NOTE — Telephone Encounter (Signed)
Pt came in for nurse visit today and mentioned getting an electric chair. Needs a script from Dr. Alwyn Ren in order to be reimbursed for some of the cost? Medicare stated they would have to pay up front and then if Dr. Jovita Gamma them a script they would be reimbursed by almost 80%? Daughter mentioned it and wanted to know what we thought.

## 2014-12-19 NOTE — Telephone Encounter (Signed)
To determine the best and most appropriate equipment for her; I recommend a physical therapy evaluation. His critical that we get the equipment that will meet her needs and be user-friendly.

## 2014-12-20 LAB — C-REACTIVE PROTEIN: CRP: 2.5 mg/dL — ABNORMAL HIGH (ref <0.60)

## 2014-12-20 LAB — RHEUMATOID FACTOR: Rhuematoid fact SerPl-aCnc: 14 IU/mL (ref <=14)

## 2014-12-20 NOTE — Telephone Encounter (Signed)
LVM for daughter to call back.  

## 2014-12-21 LAB — ANA W/REFLEX: Anti Nuclear Antibody(ANA): NEGATIVE

## 2014-12-22 LAB — STRIATED MUSCLE ANTIBODY: Striated Muscle Ab: <1:40 {titer}

## 2014-12-25 LAB — ACETYLCHOLINE RECEPTOR, BINDING

## 2014-12-26 ENCOUNTER — Encounter: Payer: Self-pay | Admitting: Internal Medicine

## 2014-12-26 NOTE — Telephone Encounter (Signed)
Please advise 

## 2015-01-10 ENCOUNTER — Other Ambulatory Visit: Payer: Self-pay | Admitting: Internal Medicine

## 2015-01-17 ENCOUNTER — Ambulatory Visit (INDEPENDENT_AMBULATORY_CARE_PROVIDER_SITE_OTHER): Payer: Medicare Other | Admitting: Internal Medicine

## 2015-01-17 ENCOUNTER — Encounter: Payer: Self-pay | Admitting: Internal Medicine

## 2015-01-17 ENCOUNTER — Telehealth: Payer: Self-pay | Admitting: Internal Medicine

## 2015-01-17 VITALS — BP 124/56 | HR 57 | Temp 97.9°F | Resp 18 | Wt 145.0 lb

## 2015-01-17 DIAGNOSIS — M1612 Unilateral primary osteoarthritis, left hip: Secondary | ICD-10-CM

## 2015-01-17 NOTE — Progress Notes (Signed)
Pre visit review using our clinic review tool, if applicable. No additional management support is needed unless otherwise documented below in the visit note. 

## 2015-01-17 NOTE — Telephone Encounter (Signed)
Eunice Blase is calling regarding power wheelchair. She received a call from the patients daughter that one is needed. She needs some information. Please give her a call.

## 2015-01-17 NOTE — Progress Notes (Signed)
   Subjective:    [name], female    DOB: 02/26/1917, 82 y.o.   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 She has constant pain in this area with any movement or attempts at ambulation. This occurs even if she is using a rolling walker. He has told her that she has bone-on-bone but is not a candidate for total hip replacement due to her age and comorbidities.  She lives in a studio apartment and this is located at a great distance from [employer]. Taking tramadol one half twice a day-3 times a day provide some relief but she is essentially apartment bound because of the pain.  She does have occasional radiation of the pain along the left lateral aspect of the leg down to the knee.  Review of Systems She has no associated numbness, tingling, weakness in the left leg.  She has no incontinence of urine or stool.  She has no vertigo or gait imbalance beyond alteration for her pain.    Objective:   Physical Exam  Pertinent or positive findings include: She is in the wheelchair and leaning to her right because of pain in the left hip. She has severe osteoarthritic change in her hands. Pedal pulses are decreased. She has trace edema over the dorsum of the feet. There is no pain with range of motion the knee but she has severe pain with any movement of the left hip.  General appearance :adequately nourished; in no distress.  Eyes: No conjunctival inflammation or scleral icterus is present.  Heart:  Normal rate and regular rhythm. S1 and S2 normal without gallop, murmur, click, rub or other extra sounds    Lungs:Chest clear to auscultation; no wheezes, rhonchi,rales ,or rubs present.No increased work of breathing.   Abdomen: bowel sounds normal, soft and non-tender without masses, organomegaly or hernias noted.  No guarding or rebound.   Vascular : all pulses equal ; no bruits  present.  Skin:Pale but warm & dry.  Intact without suspicious lesions or rashes ; no tenting or jaundice   Lymphatic: No lymphadenopathy is noted about the head, neck, axilla   Neuro: Strength, tone decreased        Assessment & Plan:  #1 end-stage degenerative joint disease of left hip. She's not a surgical candidate.  Plan: She'll be evaluated by the appropriate therapist for a power wheelchair.

## 2015-01-17 NOTE — Patient Instructions (Signed)
The  referral for violation of a power wheelchair will be scheduled and you'll be notified of the time.Please call the Referral Co-Ordinator @ [phone] if you have not been notified of appointment time within 7-10 days.

## 2015-01-18 NOTE — Telephone Encounter (Signed)
Spoke with Debbie from [facility]. Will be faxing over OV notes and demographics to [phone]. Will call back if any further eval is needed.

## 2015-01-24 NOTE — Telephone Encounter (Signed)
LVM for Heather Morrison to call back and inform on what elements needed to be in the office visit notes in order to send back in.

## 2015-01-24 NOTE — Telephone Encounter (Signed)
Heather Morrison from [facility] has returned your call

## 2015-01-24 NOTE — Telephone Encounter (Signed)
Spoke with Debbie from [facility], she stated that OV notes were needed in more detail for the pt. Pt would not be able to get see by PT in the near future and would need to come back in for an OV or have OV note sent back in. Dr hopper stated that he did not feel it was necessary to have the pt come back in and would send in office notes once restated.

## 2015-01-24 NOTE — Telephone Encounter (Signed)
Can you please Eunice Blase from [facility] - [phone]

## 2015-01-25 NOTE — Telephone Encounter (Signed)
Heather Morrison called to advise that she will be available anytime today EXCEPT 2:30-3:30.   She also gave me these details-  She states that these things all need to be in the note:  the main reason for the visit ( being a power wheelchair),  upper extremity and lower extremity strengths (rated on a 0-5 scale bilaterally for upper and lower),  and have the following ruled out:  -why patient is unable to use a cane/ walker -why patient is unable to use a manual wheelchair -why patient is unable to use a scooter (she wanted to emphasize that scooters have handlebars, and normally physicians rule out scooters because the home environment is not appropriate for the scooter)  It should state that the patient has the physical and mental ability to operate the power wheelchair  She states that the MD refers to pain in the last visit. She requests that he rate this on a 0-10 pain scale overall.   She requests that he remove the statement that the patient needs to be evaluated by a therapist for a wheelchair, since he determined that it was not needed.

## 2015-01-25 NOTE — Telephone Encounter (Signed)
She will be on vacation next week, FYI.

## 2015-01-28 ENCOUNTER — Other Ambulatory Visit: Payer: Self-pay | Admitting: Internal Medicine

## 2015-01-28 DIAGNOSIS — I739 Peripheral vascular disease, unspecified: Secondary | ICD-10-CM

## 2015-01-28 DIAGNOSIS — M1612 Unilateral primary osteoarthritis, left hip: Secondary | ICD-10-CM

## 2015-01-28 DIAGNOSIS — R531 Weakness: Secondary | ICD-10-CM

## 2015-01-30 ENCOUNTER — Ambulatory Visit (INDEPENDENT_AMBULATORY_CARE_PROVIDER_SITE_OTHER)
Admission: RE | Admit: 2015-01-30 | Discharge: 2015-01-30 | Disposition: A | Discharge status: Home / Self Care | Payer: Medicare Other | Source: Ambulatory Visit | Attending: Internal Medicine | Admitting: Internal Medicine

## 2015-01-30 ENCOUNTER — Ambulatory Visit (INDEPENDENT_AMBULATORY_CARE_PROVIDER_SITE_OTHER): Payer: Medicare Other | Admitting: Internal Medicine

## 2015-01-30 ENCOUNTER — Other Ambulatory Visit: Payer: Self-pay | Admitting: Internal Medicine

## 2015-01-30 ENCOUNTER — Encounter: Payer: Self-pay | Admitting: Internal Medicine

## 2015-01-30 ENCOUNTER — Other Ambulatory Visit (INDEPENDENT_AMBULATORY_CARE_PROVIDER_SITE_OTHER): Payer: Medicare Other

## 2015-01-30 VITALS — BP 144/72 | HR 63 | Temp 98.3°F | Resp 16

## 2015-01-30 DIAGNOSIS — R509 Fever, unspecified: Secondary | ICD-10-CM

## 2015-01-30 DIAGNOSIS — M7989 Other specified soft tissue disorders: Secondary | ICD-10-CM

## 2015-01-30 DIAGNOSIS — M79641 Pain in right hand: Secondary | ICD-10-CM

## 2015-01-30 DIAGNOSIS — M109 Gout, unspecified: Secondary | ICD-10-CM

## 2015-01-30 LAB — CBC WITH DIFFERENTIAL/PLATELET
BASOS ABS: 0.1 10*3/uL (ref 0.0–0.1)
BASOS PCT: 0.5 % (ref 0.0–3.0)
Eosinophils Absolute: 0.1 10*3/uL (ref 0.0–0.7)
Eosinophils Relative: 0.6 % (ref 0.0–5.0)
HEMATOCRIT: 36.5 % (ref 36.0–46.0)
HEMOGLOBIN: 12.2 g/dL (ref 12.0–15.0)
LYMPHS ABS: 2 10*3/uL (ref 0.7–4.0)
Lymphocytes Relative: 20.1 % (ref 12.0–46.0)
MCHC: 33.4 g/dL (ref 30.0–36.0)
MCV: 84.4 fl (ref 78.0–100.0)
MONOS PCT: 6.7 % (ref 3.0–12.0)
Monocytes Absolute: 0.7 10*3/uL (ref 0.1–1.0)
NEUTROS ABS: 7.2 10*3/uL (ref 1.4–7.7)
Neutrophils Relative %: 72.1 % (ref 43.0–77.0)
Platelets: 239 10*3/uL (ref 150.0–400.0)
RBC: 4.32 Mil/uL (ref 3.87–5.11)
RDW: 13.4 % (ref 11.5–15.5)
WBC: 10 10*3/uL (ref 4.0–10.5)

## 2015-01-30 LAB — URIC ACID: URIC ACID, SERUM: 10.9 mg/dL — AB (ref 2.4–7.0)

## 2015-01-30 MED ORDER — FEBUXOSTAT 40 MG PO TABS: 40.0000 mg | ORAL_TABLET | Freq: Every day | ORAL | Status: DC

## 2015-01-30 MED ORDER — PREDNISONE 10 MG PO TABS: ORAL_TABLET | ORAL | Status: DC

## 2015-01-30 NOTE — Patient Instructions (Signed)
  Your next office appointment will be determined based upon review of your pending labs  and  xrays  Those written interpretation of the lab results and instructions will be transmitted to you by My Chart   Critical results will be called.   Followup as needed for any active or acute issue. Please report any significant change in your symptoms.  Use a cell phone camera to monitor  the hand changes. Take a photo every  day to define any change in size, shape or color.  Please report warning signs as we discussed. Worrisome would be red streaks up the extremity, increased pain, fever, or pus production.

## 2015-01-30 NOTE — Progress Notes (Signed)
Subjective:    [name], female    DOB: 1917/03/19, 81 y.o.   MRN: [id number]  HPI  She was to be seen to be evaluated for a power wheelchair; but she's developed an acute issue in the right hand. As of 01/19/15 she developed swelling in the right thumb from the DIP to the MCP joint. There was progressive swelling and redness to involve the index and third fingers as well. The redness extended to the wrist but has begun to recede. She also had minor redness at the base of the fourth finger. This is associated with constant pain up to level VII out of 10. It precludes activities of daily living. She is unable to close her fingers or make a fist. It also has impacted sleep. She had been taking tramadol half twice a day but now has increased this to 1 in the morning and half in the evening. This is been associated with low-grade fever without localizing symptoms or signs. She also describes fatigue. The pain has precluded going to the dining room and she's taking her meals in her room.  There was no trigger or predisposition to this. She questions a relationship to eating shrimp once a week. She does not ingest excess amounts of red meat or alcohol.  She does have a past history of gout  In reference to the wheelchair evaluation; the following findings were noted. Typically the right hand has good strength up to 4-5. At this time there is no strength  due to the acute swelling and pain. The left hand /upper extremity demonstrates strength of 3-4. The right lower extremity is found to have a strength 3+ on a 5 scale. She has a 1+ strength in the left lower extremity with pain to opposition. Additionally she's unable to use a cane as she has severe intractable pain in left hip with any attempted ambulation. This this pain is at least a 10 on a 10 scale. The left lower extremity hip pain inhibits her ability to mount a scooter. Additionally her apartment area would preclude a skooter. She  would not have the strength to power a manual wheelchair. She is oriented 3 and mentally capable of employing the power wheelchair.   Review of Systems  Frontal headache, facial pain , nasal purulence, dental pain, sore throat , otic pain or otic discharge denied.  Cough, sputum production, hemoptysis, or pleuritic pain denied.  No diarrhea present.Cola colored urine or clay colored stools denied.  Dysuria, pyuria, or hematuria not present.  No vaginal discharge or bleeding noted.  No new  pustules, vesicles.  No significant travel, pet, or tick exposures.       Objective:   Physical Exam  Pertinent or positive findings include: She sits in the wheelchair and is obviously uncomfortable due to the marked swelling the right hand. There is severe swelling of the thumb, index, and third fingers with erythema and increased temperature. The redness extends to the MCP joint especially at the index finger. She has no grip due to pain in the right hand. There is severe mixed DIP/PIP arthritic changes in the hands. There is tenderness to palpation over the involved digits. She has scattered bruising over the extremities. The veins are very prominent over the right hand and forearm.  General appearance :adequately nourished; in no distress.  Eyes: No conjunctival inflammation or scleral icterus is present.  Heart:  Normal rate and regular rhythm. S1 and S2 normal without gallop, murmur,  click, rub or other extra sounds    Lungs:Chest clear to auscultation; no wheezes, rhonchi,rales ,or rubs present.No increased work of breathing.   Abdomen: bowel sounds normal, soft and non-tender without masses, organomegaly or hernias noted.  No guarding or rebound.  Vascular : all pulses equal ; no bruits present.  Skin:Warm & dry; no tenting or jaundice   Lymphatic: No lymphadenopathy is noted about the head, neck, axilla,or epitrochlear area       Assessment & Plan:  #1 severe pain, swelling,  and redness of the right hand. Rule out gout versus osteomyelitis. The absence of systemic toxicity mitigates against the latter.  #2 appropriate candidate for power wheelchair due to the documented limitations/restrictions above.  See orders

## 2015-01-30 NOTE — Progress Notes (Signed)
Pre visit review using our clinic review tool, if applicable. No additional management support is needed unless otherwise documented below in the visit note. 

## 2015-01-31 ENCOUNTER — Encounter: Payer: Self-pay | Admitting: Internal Medicine

## 2015-02-15 ENCOUNTER — Telehealth: Payer: Self-pay | Admitting: Emergency Medicine

## 2015-02-15 NOTE — Telephone Encounter (Signed)
Spoke with Debbie from [facility] she is faxing over new paperwork to be faxed back over for power wheel chair.

## 2015-02-18 ENCOUNTER — Other Ambulatory Visit: Payer: Self-pay | Admitting: Internal Medicine

## 2015-02-18 NOTE — Telephone Encounter (Signed)
Debbie from [facility] called to verify you received their fax Please call her at [phone] Pt's daughter is wanting to know

## 2015-02-18 NOTE — Telephone Encounter (Signed)
Please advise 

## 2015-02-18 NOTE — Telephone Encounter (Signed)
F[facility]ed [facility] to [facility].../lmb 

## 2015-02-18 NOTE — Telephone Encounter (Signed)
OK X 1  My retirement date is 05/25/2015; but I will be in office on a limited schedule Oct-Dec. To guarantee continuity of care you should transition your care to another PCP ASAP    

## 2015-02-19 NOTE — Telephone Encounter (Signed)
Paperwork for wheel chair has been re faxed with correct documentation

## 2015-03-11 ENCOUNTER — Other Ambulatory Visit: Payer: Self-pay | Admitting: Emergency Medicine

## 2015-03-11 ENCOUNTER — Telehealth: Payer: Self-pay | Admitting: Internal Medicine

## 2015-03-11 MED ORDER — PREDNISONE 5 MG PO TABS: ORAL_TABLET | ORAL | Status: DC

## 2015-03-11 NOTE — Telephone Encounter (Signed)
5 mg qd prn #15

## 2015-03-11 NOTE — Telephone Encounter (Signed)
Patient is requesting a refill on prednisone for her gout of the right hand. States that it came back when she finished it. Also states that she has plenty of the uloric still.

## 2015-03-11 NOTE — Telephone Encounter (Signed)
Called to inform pt.

## 2015-03-20 ENCOUNTER — Other Ambulatory Visit: Payer: Self-pay | Admitting: Internal Medicine

## 2015-03-20 NOTE — Telephone Encounter (Signed)
OK X1 

## 2015-03-20 NOTE — Telephone Encounter (Signed)
Called Tramadol into pharmacy spoke with Carollee HerterShannon gave md approval. prednisone went electronically...Raechel Chute/lmb

## 2015-03-20 NOTE — Telephone Encounter (Signed)
[facility]?

## 2015-04-05 ENCOUNTER — Other Ambulatory Visit: Payer: Self-pay | Admitting: Internal Medicine

## 2015-04-05 NOTE — Telephone Encounter (Signed)
Please advise 

## 2015-04-05 NOTE — Telephone Encounter (Signed)
#  10 Long term prednisone has significant risk Would need OV for additional refills

## 2015-04-22 ENCOUNTER — Other Ambulatory Visit: Payer: Self-pay | Admitting: Internal Medicine

## 2015-04-22 NOTE — Telephone Encounter (Signed)
F[facility]ed [facility] to [facility].../lmb 

## 2015-04-22 NOTE — Telephone Encounter (Signed)
MD out of office this week pls advise on refill.../lmb 

## 2015-04-24 ENCOUNTER — Other Ambulatory Visit: Payer: Self-pay | Admitting: Internal Medicine

## 2015-04-24 NOTE — Telephone Encounter (Signed)
Please advise 

## 2015-04-24 NOTE — Telephone Encounter (Signed)
OK X1  Needs new PCP 

## 2015-06-13 ENCOUNTER — Telehealth: Payer: Self-pay | Admitting: Internal Medicine

## 2015-06-13 MED ORDER — TRAMADOL HCL 50 MG PO TABS: ORAL_TABLET | ORAL | Status: DC

## 2015-06-13 NOTE — Telephone Encounter (Signed)
Done hardcopy to dahlia 

## 2015-06-13 NOTE — Telephone Encounter (Signed)
Rx faxed to pharmacy  

## 2015-06-13 NOTE — Telephone Encounter (Signed)
Pt is needing a refill for traMADol (ULTRAM) 50 MG tablet [[id number] She is transferring her care to Dr. Chilton Si at the [facility] and her appt is for 1/26 with him.  She will be out of medication by then  Pharmacy is [facility] [address]

## 2015-06-13 NOTE — Telephone Encounter (Signed)
Pls advise.  

## 2015-06-14 NOTE — Telephone Encounter (Signed)
Received refill request for Levothyroxine, spoke with pt she said that she has enough to get her until her appt with Dr Chilton Si at [facility]. Instructed her to call back if she was unable to see Dr Chilton Si and needed to switch to new pcp in our office.

## 2015-06-20 ENCOUNTER — Encounter: Payer: Self-pay | Admitting: Nurse Practitioner

## 2015-06-20 ENCOUNTER — Non-Acute Institutional Stay: Payer: Medicare Other | Admitting: Nurse Practitioner

## 2015-06-20 VITALS — BP 142/72 | HR 62 | Temp 97.4°F | Ht 64.0 in | Wt 141.0 lb

## 2015-06-20 DIAGNOSIS — N184 Chronic kidney disease, stage 4 (severe): Secondary | ICD-10-CM

## 2015-06-20 DIAGNOSIS — K219 Gastro-esophageal reflux disease without esophagitis: Secondary | ICD-10-CM

## 2015-06-20 DIAGNOSIS — I10 Essential (primary) hypertension: Secondary | ICD-10-CM

## 2015-06-20 DIAGNOSIS — R609 Edema, unspecified: Secondary | ICD-10-CM

## 2015-06-20 DIAGNOSIS — I739 Peripheral vascular disease, unspecified: Secondary | ICD-10-CM | POA: Diagnosis not present

## 2015-06-20 DIAGNOSIS — E039 Hypothyroidism, unspecified: Secondary | ICD-10-CM

## 2015-06-20 DIAGNOSIS — M150 Primary generalized (osteo)arthritis: Secondary | ICD-10-CM

## 2015-06-20 DIAGNOSIS — N189 Chronic kidney disease, unspecified: Secondary | ICD-10-CM

## 2015-06-20 DIAGNOSIS — M159 Polyosteoarthritis, unspecified: Secondary | ICD-10-CM

## 2015-06-20 MED ORDER — TRAMADOL HCL 50 MG PO TABS: ORAL_TABLET | ORAL | Status: DC

## 2015-06-20 MED ORDER — LEVOTHYROXINE SODIUM 112 MCG PO TABS
112.0000 ug | ORAL_TABLET | Freq: Every day | ORAL | Status: DC
Start: 2015-06-20 — End: 2015-09-27

## 2015-06-20 NOTE — Assessment & Plan Note (Signed)
Stable, TED, chronic, hx of wound, continue Furosemide  

## 2015-06-20 NOTE — Assessment & Plan Note (Signed)
Controlled, continue Amlodipine , Furosemide , update CMP

## 2015-06-20 NOTE — Progress Notes (Signed)
[name], female   DOB: 1916-08-19, 82 y.o.   MRN: [id number]  Location:  clinic [facility] Provider:  Chipper Oman NP  Code Status:  DNR Goals of care: Advanced Directive information    Chief Complaint  Patient presents with  . Medical Management of Chronic Issues    New patient to [facility], previous doctor Dr. Alwyn Ren retired     HPI: Patient is a 82 y.o. female seen in the clinic at [facility] today for evaluation of new patient establishment, managing chronic medical issues, HTN controlled on Amlodipine , BLE edema, stable on Furosemide , hypothyroidism, taking Levothyroxine , left hip pain is managed with Tramadol prn.   Review of Systems:  Review of Systems  Constitutional: Negative for fever, chills, weight loss, malaise/fatigue and diaphoresis.  HENT: Positive for hearing loss. Negative for congestion, ear discharge, ear pain, nosebleeds and tinnitus.   Eyes: Negative for blurred vision, double vision, photophobia, pain, discharge and redness.  Respiratory: Negative for cough, hemoptysis, sputum production, shortness of breath and stridor.   Cardiovascular: Positive for leg swelling. Negative for chest pain, palpitations, orthopnea and claudication.  Gastrointestinal: Negative for heartburn, nausea, vomiting, abdominal pain, diarrhea, constipation, blood in stool and melena.  Genitourinary: Positive for frequency. Negative for dysuria, urgency, hematuria and flank pain.  Musculoskeletal: Positive for joint pain. Negative for myalgias, back pain, falls and neck pain.       Left hip pain, not a surgical candidate, s/p right hip replacement.   Skin: Negative for itching and rash.  Neurological: Negative for dizziness, tingling, tremors, sensory change, speech change, focal weakness, seizures, loss of consciousness, weakness and headaches.  Endo/Heme/Allergies: Negative for environmental allergies and polydipsia. Does not bruise/bleed easily.    Psychiatric/Behavioral: Negative for depression, suicidal ideas, hallucinations, memory loss and substance abuse. The patient is not nervous/anxious and does not have insomnia.        Not sleep well due to left hip pain.     Past Medical History  Diagnosis Date  . Coronary atherosclerosis of native coronary artery 2008    ACS; Dr Excell Seltzer  . Atherosclerosis of renal artery (HCC)   . Essential hypertension, benign   . Pure hypercholesterolemia   . Carotid bruit 1998    negative Doppler  . Renal artery stenosis (HCC)   . Thyroid disease   . Arthritis     Gout    Patient Active Problem List   Diagnosis Date Noted  . Acute gout of right hand 01/30/2015  . Diplopia 12/15/2014  . Peripheral arterial occlusive disease (HCC) 03/23/2014  . Chronic venous insufficiency 03/23/2014  . Leg wound, left 02/16/2014  . PVD (peripheral vascular disease) (HCC) 02/13/2014  . Edema 02/01/2014  . Chronic renal insufficiency, stage IV (severe) 02/01/2014  . Coronary atherosclerosis of native coronary artery 08/21/2013  . HYPOTENSION 01/22/2010  . ARTHRALGIA 01/22/2010  . CAD 04/25/2009  . RENAL ARTERY STENOSIS 04/25/2009  . GERD 04/25/2009  . Osteoarthritis 04/25/2009  . Hypothyroidism 04/17/2009  . HYPERLIPIDEMIA 04/17/2009  . Peripheral vascular disease (HCC) 10/25/2007  . OSTEOPENIA 10/25/2007  . GOUT 12/01/2006  . Essential hypertension 11/15/2006    No Known Allergies  Medications: Patient's Medications  New Prescriptions   No medications on file  Previous Medications   AMLODIPINE (NORVASC) 10 MG TABLET    TAKE 1 BY MOUTH DAILY   CALCIUM-VITAMIN D (CALTRATE 600 PLUS-VIT D PO)    Take 1 tablet by mouth every evening.    FUROSEMIDE (LASIX)  40 MG TABLET    TAKE 1 BY MOUTH DAILY   MULTIPLE VITAMIN (MULTIVITAMIN) CAPSULE    Take 1 capsule by mouth every evening.   Modified Medications   Modified Medication Previous Medication   LEVOTHYROXINE (SYNTHROID, LEVOTHROID) 112 MCG TABLET  levothyroxine (SYNTHROID, LEVOTHROID) 112 MCG tablet      Take 1 tablet (112 mcg total) by mouth daily.    Take 1 tablet (112 mcg total) by mouth daily.   TRAMADOL (ULTRAM) 50 MG TABLET traMADol (ULTRAM) 50 MG tablet      TAKE ONE-HALF TO ONE TABLET BY MOUTH EVERY 8 HOURS AS NEEDED    TAKE ONE-HALF TO ONE TABLET BY MOUTH EVERY 8 HOURS AS NEEDED  Discontinued Medications   VITAMIN D, CHOLECALCIFEROL, PO    Take 800 mg by mouth daily. Reported on 06/20/2015    Physical Exam: Filed Vitals:   06/20/15 1633  BP: 142/72  Pulse: 62  Temp: 97.4 F (36.3 C)  TempSrc: Oral  Height:  (1.626 m)  Weight: 141 lb (63.957 kg)  SpO2: 97%   Body mass index is 24.19 kg/(m^2).  Physical Exam  Constitutional: She appears well-developed and well-nourished. She appears distressed.  HENT:  Head: Normocephalic and atraumatic.  Right Ear: External ear normal.  Left Ear: External ear normal.  Nose: Nose normal.  Mouth/Throat: Oropharynx is clear and moist.  Eyes: Conjunctivae and EOM are normal. Pupils are equal, round, and reactive to light. Right eye exhibits no discharge. Left eye exhibits no discharge. No scleral icterus.  Neck: Normal range of motion. Neck supple. No JVD present. No tracheal deviation present. No thyromegaly present.  Cardiovascular: Normal rate, regular rhythm, normal heart sounds and intact distal pulses.  Exam reveals no gallop and no friction rub.   No murmur heard. Pulmonary/Chest: No stridor. No respiratory distress. She has no wheezes. She has no rales. She exhibits no tenderness.  Abdominal: Soft. Bowel sounds are normal. She exhibits no distension and no mass. There is no tenderness. There is no rebound and no guarding. No hernia.  Genitourinary: Vagina normal. Guaiac negative stool. No vaginal discharge found.  Musculoskeletal: Normal range of motion. She exhibits no edema or tenderness.  Left hip pain, not a surgical candidate, s/p right hip replacement.     Lymphadenopathy:    She has no cervical adenopathy.  Neurological: She displays normal reflexes. No cranial nerve deficit. She exhibits normal muscle tone. Coordination normal.  Skin: Skin is warm and dry. No rash noted. She is not diaphoretic. No erythema. No pallor.  Psychiatric: She has a normal mood and affect. Her behavior is normal. Judgment and thought content normal.    Labs reviewed: Basic Metabolic Panel: No results for input(s): NA, K, CL, CO2, GLUCOSE, BUN, CREATININE, CALCIUM, MG, PHOS in the last 8760 hours.  Liver Function Tests: No results for input(s): AST, ALT, ALKPHOS, BILITOT, PROT, ALBUMIN in the last 8760 hours.  CBC:  Recent Labs  12/19/14 1521 01/30/15 1652  WBC 7.5 10.0  NEUTROABS 4.5 7.2  HGB 12.5 12.2  HCT 37.6 36.5  MCV 85.6 84.4  PLT 277.0 239.0    Lab Results  Component Value Date   TSH 0.22* 12/19/2014   No results found for: HGBA1C Lab Results  Component Value Date   CHOL 130 02/17/2013   HDL 51 02/17/2013   LDLCALC 60 02/17/2013   LDLDIRECT 122.7 08/08/2010   TRIG 96 02/17/2013   CHOLHDL 3 11/03/2012    Significant Diagnostic Results since last visit:  none  Patient Care Team: [name], MD as PCP - General (Internal Medicine) Ollen Gross, MD as Consulting Physician (Orthopedic Surgery) Janalyn Harder, MD as Consulting Physician (Dermatology) Tonny Bollman, MD as Consulting Physician (Cardiology) Terrial Rhodes, MD as Consulting Physician (Nephrology) Antonietta Barcelona, OD as Consulting Physician (Optometry)  Assessment/Plan Problem List Items Addressed This Visit    PVD (peripheral vascular disease) (HCC)    Stable, TED, chronic, hx of wound, continue Furosemide      Osteoarthritis    Left hip pain, not a surgical candidate, s/p right hip replacement.       Relevant Medications   traMADol (ULTRAM) 50 MG tablet   Hypothyroidism    Taking Levothyroxine daily, update TSH and CBC      Relevant  Medications   levothyroxine (SYNTHROID, LEVOTHROID) 112 MCG tablet   GERD    Stable.       Essential hypertension - Primary    Controlled, continue Amlodipine , Furosemide , update CMP      Edema    Trace BLE edema, continue Furosemide  daily.       Chronic renal insufficiency, stage IV (severe)    Will update CMP          Family/ staff Communication: continue [place] for care needs.   Labs/tests ordered:  CBC, CMP, TSH  ManXie Vola Beneke NP Geriatrics [facility] [address], [place] [address] On Call:  [phone] & follow prompts after 5pm & weekends Office Phone:  [phone] Office Fax:  [phone]

## 2015-06-20 NOTE — Assessment & Plan Note (Signed)
Left hip pain, not a surgical candidate, s/p right hip replacement.

## 2015-06-20 NOTE — Assessment & Plan Note (Signed)
Taking Levothyroxine daily, update TSH and CBC

## 2015-06-20 NOTE — Assessment & Plan Note (Signed)
Will update CMP 

## 2015-06-20 NOTE — Assessment & Plan Note (Signed)
Trace BLE edema, continue Furosemide  daily.

## 2015-06-20 NOTE — Assessment & Plan Note (Signed)
Stable

## 2015-08-06 ENCOUNTER — Encounter: Payer: Self-pay | Admitting: Nurse Practitioner

## 2015-09-19 ENCOUNTER — Encounter: Payer: Self-pay | Admitting: Internal Medicine

## 2015-09-19 LAB — HEPATIC FUNCTION PANEL
ALK PHOS: 94 U/L (ref 25–125)
ALT: 9 U/L (ref 7–35)
AST: 19 U/L (ref 13–35)
BILIRUBIN, TOTAL: 0.6 mg/dL

## 2015-09-19 LAB — BASIC METABOLIC PANEL
BUN: 34 mg/dL — AB (ref 4–21)
Creatinine: 1.6 mg/dL — AB (ref <=1.1)
Glucose: 79 mg/dL
POTASSIUM: 3.9 mmol/L (ref 3.4–5.3)
SODIUM: 143 mmol/L (ref 137–147)

## 2015-09-19 LAB — CBC AND DIFFERENTIAL
HEMATOCRIT: 36 % (ref 36–46)
HEMOGLOBIN: 11.8 g/dL — AB (ref 12.0–16.0)
Platelets: 243 10*3/uL (ref 150–399)
WBC: 5.9 10^3/mL

## 2015-09-26 ENCOUNTER — Non-Acute Institutional Stay: Payer: Medicare Other | Admitting: Nurse Practitioner

## 2015-09-26 DIAGNOSIS — N189 Chronic kidney disease, unspecified: Secondary | ICD-10-CM | POA: Diagnosis not present

## 2015-09-26 DIAGNOSIS — M150 Primary generalized (osteo)arthritis: Secondary | ICD-10-CM | POA: Diagnosis not present

## 2015-09-26 DIAGNOSIS — E039 Hypothyroidism, unspecified: Secondary | ICD-10-CM | POA: Diagnosis not present

## 2015-09-26 DIAGNOSIS — M109 Gout, unspecified: Secondary | ICD-10-CM

## 2015-09-26 DIAGNOSIS — I739 Peripheral vascular disease, unspecified: Secondary | ICD-10-CM

## 2015-09-26 DIAGNOSIS — N2889 Other specified disorders of kidney and ureter: Secondary | ICD-10-CM

## 2015-09-26 DIAGNOSIS — M159 Polyosteoarthritis, unspecified: Secondary | ICD-10-CM

## 2015-09-26 DIAGNOSIS — I10 Essential (primary) hypertension: Secondary | ICD-10-CM | POA: Diagnosis not present

## 2015-09-26 DIAGNOSIS — N184 Chronic kidney disease, stage 4 (severe): Secondary | ICD-10-CM

## 2015-09-26 NOTE — Progress Notes (Signed)
[name], female   DOB: 1916-08-19, 82 y.o.   MRN: [id number]   Location:  [facility]   Place of Service:  Clinic (12) Provider: Chipper OmanManXie Hussein Macdougal NP  Code Status: DNR Goals of Care:  Advanced Directives 09/26/2015  Does patient have an advance directive? Yes  Type of Advance Directive Healthcare Power of Attorney  Copy of advanced directive(s) in chart? Yes     Chief Complaint  Patient presents with  . Medical Management of Chronic Issues    Routine visit    HPI: Patient is a 82 y.o. female seen today for medical management of chronic diseases.  Hx of HTN, controlled on Amlodipine 10mg , Furosemide 40mg . Takes Levothyroxine 112mcg, last TSH 0.06 09/19/15. CKD Bun34, creat 1.6 09/19/15   Past Medical History  Diagnosis Date  . Coronary atherosclerosis of native coronary artery 2008    ACS; Dr Excell Seltzerooper  . Atherosclerosis of renal artery (HCC)   . Essential hypertension, benign   . Pure hypercholesterolemia   . Carotid bruit 1998    negative Doppler  . Renal artery stenosis (HCC)   . Thyroid disease   . Arthritis     Gout    Past Surgical History  Procedure Laterality Date  . Varicose vein surgery  1978  . Partial hip arthroplasty  2004  . Cataract extraction    . Total abdominal hysterectomy with uso      Dr Shea Evansunn, for fibroids  . Cardiac catherization  2008    cardiac cath and abdominal aortogram performed 08/30/2006    No Known Allergies    Medication List       This list is accurate as of: 09/26/15 11:59 PM.  Always use your most recent med list.               amLODipine 10 MG tablet  Commonly known as:  NORVASC  TAKE 1 BY MOUTH DAILY     CALTRATE 600 PLUS-VIT D PO  Take 1 tablet by mouth every evening.     colchicine 0.6 MG tablet  Take 0.6 mg by mouth daily.     febuxostat 40 MG tablet  Commonly known as:  ULORIC  Take 40 mg by mouth daily.     furosemide 40 MG tablet  Commonly known as:  LASIX  TAKE 1 BY MOUTH DAILY     levothyroxine 112 MCG tablet  Commonly known as:  SYNTHROID, LEVOTHROID  Take 1 tablet (112 mcg total) by mouth daily.     multivitamin capsule  Take 1 capsule by mouth every evening.     traMADol 50 MG tablet  Commonly known as:  ULTRAM  TAKE ONE-HALF TO ONE TABLET BY MOUTH EVERY 8 HOURS AS NEEDED        Review of Systems:  Review of Systems  Constitutional: Negative for fever, chills and diaphoresis.  HENT: Positive for hearing loss. Negative for congestion, ear discharge, ear pain, nosebleeds and tinnitus.   Eyes: Negative for photophobia, pain, discharge and redness.  Respiratory: Negative for cough, shortness of breath and stridor.   Cardiovascular: Positive for leg swelling. Negative for chest pain and palpitations.  Gastrointestinal: Negative for nausea, vomiting, abdominal pain, diarrhea, constipation and blood in stool.  Endocrine: Negative for polydipsia.  Genitourinary: Positive for frequency. Negative for dysuria, urgency, hematuria and flank pain.  Musculoskeletal: Negative for myalgias, back pain and neck pain.       Left hip pain, not a surgical candidate, s/p right hip replacement.  Skin: Negative for rash.       Top of the left 2nd toe redness  Allergic/Immunologic: Negative for environmental allergies.  Neurological: Negative for dizziness, tremors, seizures, weakness and headaches.  Hematological: Does not bruise/bleed easily.  Psychiatric/Behavioral: Negative for suicidal ideas and hallucinations. The patient is not nervous/anxious.        Not sleep well due to left hip pain.     Health Maintenance  Topic Date Due  . ZOSTAVAX  02/13/1979  . PNA vac Low Risk Adult (2 of 2 - PPSV23) 12/19/2015  . INFLUENZA VACCINE  12/24/2015  . TETANUS/TDAP  07/28/2022  . DEXA SCAN  Completed    Physical Exam: There were no vitals filed for this visit. There is no weight on file to calculate BMI. Physical Exam  Constitutional: She appears well-developed and  well-nourished. She appears distressed.  HENT:  Head: Normocephalic and atraumatic.  Right Ear: External ear normal.  Left Ear: External ear normal.  Nose: Nose normal.  Mouth/Throat: Oropharynx is clear and moist.  Eyes: Conjunctivae and EOM are normal. Pupils are equal, round, and reactive to light. Right eye exhibits no discharge. Left eye exhibits no discharge. No scleral icterus.  Neck: Normal range of motion. Neck supple. No JVD present. No tracheal deviation present. No thyromegaly present.  Cardiovascular: Normal rate, regular rhythm, normal heart sounds and intact distal pulses.  Exam reveals no gallop and no friction rub.   No murmur heard. Pulmonary/Chest: No stridor. No respiratory distress. She has no wheezes. She has no rales. She exhibits no tenderness.  Abdominal: Soft. Bowel sounds are normal. She exhibits no distension and no mass. There is no tenderness. There is no rebound and no guarding. No hernia.  Genitourinary: Vagina normal. Guaiac negative stool. No vaginal discharge found.  Musculoskeletal: Normal range of motion. She exhibits edema. She exhibits no tenderness.  Left hip pain, not a surgical candidate, s/p right hip replacement.  Trace edema R+L ankles.   Lymphadenopathy:    She has no cervical adenopathy.  Neurological: She displays normal reflexes. No cranial nerve deficit. She exhibits normal muscle tone. Coordination normal.  Skin: Skin is warm and dry. No rash noted. She is not diaphoretic. No erythema. No pallor.  Redness on the top of left 2nd toe.   Psychiatric: She has a normal mood and affect. Her behavior is normal. Judgment and thought content normal.    Labs reviewed: Basic Metabolic Panel:  Recent Labs  16/10/96 1552 12/19/14 1521 09/19/15  NA  --   --  143  K  --   --  3.9  BUN  --   --  34*  CREATININE  --   --  1.6*  TSH 0.73 0.22*  --    Liver Function Tests:  Recent Labs  09/19/15  AST 19  ALT 9  ALKPHOS 94   No results for  input(s): LIPASE, AMYLASE in the last 8760 hours. No results for input(s): AMMONIA in the last 8760 hours. CBC:  Recent Labs  12/19/14 1521 01/30/15 1652 09/19/15  WBC 7.5 10.0 5.9  NEUTROABS 4.5 7.2  --   HGB 12.5 12.2 11.8*  HCT 37.6 36.5 36  MCV 85.6 84.4  --   PLT 277.0 239.0 243   Lipid Panel: No results for input(s): CHOL, HDL, LDLCALC, TRIG, CHOLHDL, LDLDIRECT in the last 8760 hours. No results found for: HGBA1C  Procedures since last visit: No results found.  Assessment/Plan  Chronic renal insufficiency, stage IV (severe) 09/19/15 Na  143, K 3.9, Bun 34, creat 1.6   Essential hypertension 09/19/15 Na 143, K 3.9, Bun 34, creat 1.6, Controlled, continue Amlodipine 10mg , Furosemide 40mg ,   Hypothyroidism 10/25/14 on 75 mcg 1&1/2 qd 09/19/15 TSH 0.06 Decrease Levothyroxine to daily, update TSH 12 weeks.   Osteoarthritis Left hip pain, not a surgical candidate, s/p right hip replacement. Prn Tramadol available to her   PVD (peripheral vascular disease) Stable, TED, chronic, hx of wound, continue Furosemide   GOUT Chronic redness on the top of the left 2nd toe, takes Uloric and Cholcicine, update uric acid.     Labs/tests ordered:  @ORDERS @ uric acid, TSH 12 weeks.   Next appt:  Visit date not found

## 2015-09-26 NOTE — Assessment & Plan Note (Signed)
Left hip pain, not a surgical candidate, s/p right hip replacement. Prn Tramadol available to her  

## 2015-09-26 NOTE — Assessment & Plan Note (Signed)
Stable, TED, chronic, hx of wound, continue Furosemide  

## 2015-09-26 NOTE — Assessment & Plan Note (Signed)
09/19/15 Na 143, K 3.9, Bun 34, creat 1.6, Controlled, continue Amlodipine 10mg, Furosemide 40mg, 

## 2015-09-26 NOTE — Assessment & Plan Note (Signed)
09/19/15 Na 143, K 3.9, Bun 34, creat 1.6

## 2015-09-26 NOTE — Assessment & Plan Note (Signed)
10/25/14 on 75 mcg 1&1/2 qd 09/19/15 TSH 0.06 Decrease Levothyroxine to 88mcg daily, update TSH 12 weeks.

## 2015-09-27 MED ORDER — LEVOTHYROXINE SODIUM 112 MCG PO TABS: ORAL_TABLET | ORAL | Status: DC

## 2015-09-27 NOTE — Assessment & Plan Note (Signed)
Chronic redness on the top of the left 2nd toe, takes Uloric and Cholcicine, update uric acid.

## 2015-09-27 NOTE — Addendum Note (Signed)
Addended by: Charna ElizabethWILLIAMS, DEBRA J on: 09/27/2015 04:00 PM   Modules accepted: Orders

## 2015-09-27 NOTE — Patient Instructions (Signed)
Decrease Levothyroxine 112 mcg Monday - Friday; Repeat TSH 12 weeks, [facility] labs prior to appointment TSH, Uric Acid.

## 2015-10-08 ENCOUNTER — Encounter: Payer: Self-pay | Admitting: Internal Medicine

## 2015-10-10 ENCOUNTER — Encounter: Payer: Self-pay | Admitting: Nurse Practitioner

## 2015-11-01 ENCOUNTER — Other Ambulatory Visit: Payer: Self-pay

## 2015-11-28 ENCOUNTER — Telehealth: Payer: Self-pay | Admitting: *Deleted

## 2015-11-28 NOTE — Telephone Encounter (Signed)
[facility], he stated that he would take care of it.

## 2015-11-28 NOTE — Telephone Encounter (Signed)
[facility] mail order pharmacy called regarding patient levothyroxine medication, they no longer carry the [employer] Brand and wanted to know if the [employer] brand was okay to use

## 2015-11-28 NOTE — Telephone Encounter (Signed)
It is okay to change brands. Levothyroxine should be maintained at the same micrograms strength.

## 2015-12-19 ENCOUNTER — Ambulatory Visit: Payer: Medicare Other

## 2015-12-24 ENCOUNTER — Encounter: Payer: Self-pay | Admitting: *Deleted

## 2015-12-24 ENCOUNTER — Encounter: Payer: Self-pay | Admitting: Nurse Practitioner

## 2015-12-24 LAB — TSH: TSH: 3.5 u[IU]/mL (ref 0.41–5.90)

## 2015-12-25 ENCOUNTER — Telehealth: Payer: Self-pay

## 2015-12-25 NOTE — Telephone Encounter (Signed)
12/24/15 lab results: Uric Acid 4.7, TSH 3.50, called patient with results per Dr. Chilton Si normal. Will keep appt with [facility] 01/02/16. Asked to have it faxed to Dr. Arrie Aran, done.

## 2016-01-02 ENCOUNTER — Non-Acute Institutional Stay: Payer: Medicare Other | Admitting: Nurse Practitioner

## 2016-01-02 ENCOUNTER — Encounter: Payer: Self-pay | Admitting: Nurse Practitioner

## 2016-01-02 DIAGNOSIS — I10 Essential (primary) hypertension: Secondary | ICD-10-CM | POA: Diagnosis not present

## 2016-01-02 DIAGNOSIS — M10441 Other secondary gout, right hand: Secondary | ICD-10-CM

## 2016-01-02 DIAGNOSIS — M109 Gout, unspecified: Secondary | ICD-10-CM | POA: Diagnosis not present

## 2016-01-02 DIAGNOSIS — M2550 Pain in unspecified joint: Secondary | ICD-10-CM | POA: Diagnosis not present

## 2016-01-02 DIAGNOSIS — E039 Hypothyroidism, unspecified: Secondary | ICD-10-CM | POA: Diagnosis not present

## 2016-01-02 DIAGNOSIS — R609 Edema, unspecified: Secondary | ICD-10-CM

## 2016-01-02 NOTE — Assessment & Plan Note (Signed)
Trace BLE edema, continue Furosemide  daily.

## 2016-01-02 NOTE — Progress Notes (Signed)
[name], female   DOB: [birth year], 82 y.o.   MRN: [id number]   Location:   [facility]   Place of Service:  Clinic (12) Provider: Chipper OmanManXie Fraidy Mccarrick NP  Code Status: DNR Goals of Care:  Advanced Directives 01/02/2016  Does patient have an advance directive? Yes  Type of Estate agentAdvance Directive Healthcare Power of [place];Living will  Does patient want to make changes to advanced directive? No - Patient declined  Copy of advanced directive(s) in chart? Yes     Chief Complaint  Patient presents with  . Medical Management of Chronic Issues    HPI: Patient is a 82 y.o. female seen today for medical management of chronic diseases.  Hx of HTN, controlled on Amlodipine 10mg , Furosemide 40mg . Takes Levothyroxine 112mcg, TSH 0.06 09/19/15, 3.5 12/24/15,  CKD Bun34, creat 1.6 09/19/15. Uric acid 4.7 12/24/15, taking Colchicine 0.6mg  and Uloric 40mg  daily.    Past Medical History:  Diagnosis Date  . Arthritis    Gout  . Atherosclerosis of renal artery (HCC)   . Carotid bruit 1998   negative Doppler  . Coronary atherosclerosis of native coronary artery 2008   ACS; Dr Excell Seltzerooper  . Essential hypertension, benign   . Pure hypercholesterolemia   . Renal artery stenosis (HCC)   . Thyroid disease     Past Surgical History:  Procedure Laterality Date  . cardiac catherization  2008   cardiac cath and abdominal aortogram performed 08/30/2006  . CATARACT EXTRACTION    . PARTIAL HIP ARTHROPLASTY  2004  . Total abdominal hysterectomy with USO     Dr Shea Evansunn, for fibroids  . VARICOSE VEIN SURGERY  1978    No Known Allergies    Medication List       Accurate as of 01/02/16 11:59 PM. Always use your most recent med list.          amLODipine 10 MG tablet Commonly known as:  NORVASC TAKE 1 BY MOUTH DAILY   CALTRATE 600 PLUS-VIT D PO Take 1 tablet by mouth every evening.   colchicine 0.6 MG tablet Take 0.6 mg by mouth daily.   febuxostat 40 MG tablet Commonly known as:  ULORIC Take 40 mg by  mouth daily.   furosemide 40 MG tablet Commonly known as:  LASIX TAKE 1 BY MOUTH DAILY   levothyroxine 112 MCG tablet Commonly known as:  SYNTHROID, LEVOTHROID Take one tablet daily on Monday through Friday   multivitamin capsule Take 1 capsule by mouth every evening.   traMADol 50 MG tablet Commonly known as:  ULTRAM TAKE ONE-HALF TO ONE TABLET BY MOUTH EVERY 8 HOURS AS NEEDED       Review of Systems:  Review of Systems  Constitutional: Negative for chills, diaphoresis and fever.  HENT: Positive for hearing loss. Negative for congestion, ear discharge, ear pain, nosebleeds and tinnitus.   Eyes: Negative for photophobia, pain, discharge and redness.  Respiratory: Negative for cough, shortness of breath and stridor.   Cardiovascular: Positive for leg swelling. Negative for chest pain and palpitations.  Gastrointestinal: Negative for abdominal pain, blood in stool, constipation, diarrhea, nausea and vomiting.  Endocrine: Negative for polydipsia.  Genitourinary: Positive for frequency. Negative for dysuria, flank pain, hematuria and urgency.       1-2x/night  Musculoskeletal: Negative for back pain, myalgias and neck pain.       Left hip pain, not a surgical candidate, s/p right hip replacement. Right hand the 2nd MCP enlarged, redness, painful chronic.   Skin:  Negative for rash.       Top of the left 2nd toe redness  Allergic/Immunologic: Negative for environmental allergies.  Neurological: Negative for dizziness, tremors, seizures, weakness and headaches.  Hematological: Does not bruise/bleed easily.  Psychiatric/Behavioral: Negative for hallucinations and suicidal ideas. The patient is not nervous/anxious.        Not sleep well due to left hip pain.     Health Maintenance  Topic Date Due  . ZOSTAVAX  02/13/1979  . PNA vac Low Risk Adult (2 of 2 - PPSV23) 12/19/2015  . INFLUENZA VACCINE  12/24/2015  . TETANUS/TDAP  07/28/2022  . DEXA SCAN  Completed    Physical  Exam: Vitals:   01/02/16 1506  BP: 120/60  Pulse: 67  Resp: 18  Temp: 97.9 F (36.6 C)  TempSrc: Oral  SpO2: 98%  Weight: 138 lb 8 oz (62.8 kg)  Height:  (1.626 m)   Body mass index is 23.77 kg/m. Physical Exam  Constitutional: She appears well-developed and well-nourished. She appears distressed.  HENT:  Head: Normocephalic and atraumatic.  Right Ear: External ear normal.  Left Ear: External ear normal.  Nose: Nose normal.  Mouth/Throat: Oropharynx is clear and moist.  Eyes: Conjunctivae and EOM are normal. Pupils are equal, round, and reactive to light. Right eye exhibits no discharge. Left eye exhibits no discharge. No scleral icterus.  Neck: Normal range of motion. Neck supple. No JVD present. No tracheal deviation present. No thyromegaly present.  Cardiovascular: Normal rate, regular rhythm, normal heart sounds and intact distal pulses.  Exam reveals no gallop and no friction rub.   No murmur heard. Pulmonary/Chest: No stridor. No respiratory distress. She has no wheezes. She has no rales. She exhibits no tenderness.  Abdominal: Soft. Bowel sounds are normal. She exhibits no distension and no mass. There is no tenderness. There is no rebound and no guarding. No hernia.  Genitourinary: Vagina normal. Rectal exam shows guaiac negative stool. No vaginal discharge found.  Musculoskeletal: Normal range of motion. She exhibits edema. She exhibits no tenderness.  Left hip pain, not a surgical candidate, s/p right hip replacement.  Trace edema R+L ankles. Left hip pain, not a surgical candidate, s/p right hip replacement. Right hand the 2nd MCP enlarged, redness, painful chronic.  The right 2nd toe, hammer toe, top of the toe redness and tenderness   Lymphadenopathy:    She has no cervical adenopathy.  Neurological: She displays normal reflexes. No cranial nerve deficit. She exhibits normal muscle tone. Coordination normal.  Skin: Skin is warm and dry. No rash noted. She is  not diaphoretic. No erythema. No pallor.  Redness on the top of left 2nd toe.   Psychiatric: She has a normal mood and affect. Her behavior is normal. Judgment and thought content normal.    Labs reviewed: Basic Metabolic Panel:  Recent Labs  40/98/11 12/24/15  NA 143  --   K 3.9  --   BUN 34*  --   CREATININE 1.6*  --   TSH  --  3.50   Liver Function Tests:  Recent Labs  09/19/15  AST 19  ALT 9  ALKPHOS 94   No results for input(s): LIPASE, AMYLASE in the last 8760 hours. No results for input(s): AMMONIA in the last 8760 hours. CBC:  Recent Labs  01/30/15 1652 09/19/15  WBC 10.0 5.9  NEUTROABS 7.2  --   HGB 12.2 11.8*  HCT 36.5 36  MCV 84.4  --   PLT 239.0 243  Lipid Panel: No results for input(s): CHOL, HDL, LDLCALC, TRIG, CHOLHDL, LDLDIRECT in the last 8760 hours. No results found for: HGBA1C  Procedures since last visit: No results found.  Assessment/Plan  Essential hypertension 09/19/15 Na 143, K 3.9, Bun 34, creat 1.6, Controlled, continue Amlodipine 10mg , Furosemide 40mg ,  Hypothyroidism 10/25/14 on 75 mcg 1&1/2 qd 09/19/15 TSH 0.06 Decrease Levothyroxine to daily, update TSH 12 weeks.  12/24/15 TSH 3.5  Acute gout of right hand 12/24/15 uric acid 4.7 01/02/16 continue Uloric 40mg , Colchicine 0.6 mg daily  GOUT 12/24/15 uric acid 4.7 01/02/16 continue Uloric 40mg , Colchicine 0.6 mg daily Chronic redness on the top of the left 2nd toe  Pain in joint Left hip pain, not a surgical candidate, s/p right hip replacement. Prn Tramadol available to her   Edema Trace BLE edema, continue Furosemide 40mg  daily.     Labs/tests ordered: none  Next appt:  Visit date not found

## 2016-01-02 NOTE — Assessment & Plan Note (Signed)
12/24/15 uric acid 4.7 01/02/16 continue Uloric 40mg , Colchicine 0.6 mg daily Chronic redness on the top of the left 2nd toe

## 2016-01-02 NOTE — Assessment & Plan Note (Signed)
10/25/14 on 75 mcg 1&1/2 qd 09/19/15 TSH 0.06 Decrease Levothyroxine to 88mcg daily, update TSH 12 weeks.  12/24/15 TSH 3.5

## 2016-01-02 NOTE — Assessment & Plan Note (Signed)
12/24/15 uric acid 4.7 01/02/16 continue Uloric 40mg , Colchicine 0.6 mg daily

## 2016-01-02 NOTE — Assessment & Plan Note (Signed)
09/19/15 Na 143, K 3.9, Bun 34, creat 1.6, Controlled, continue Amlodipine 10mg , Furosemide 40mg ,

## 2016-01-02 NOTE — Assessment & Plan Note (Signed)
Left hip pain, not a surgical candidate, s/p right hip replacement. Prn Tramadol available to her

## 2016-02-04 ENCOUNTER — Other Ambulatory Visit: Payer: Self-pay | Admitting: Nurse Practitioner

## 2016-02-26 ENCOUNTER — Ambulatory Visit (INDEPENDENT_AMBULATORY_CARE_PROVIDER_SITE_OTHER): Payer: Medicare Other | Admitting: Internal Medicine

## 2016-02-26 ENCOUNTER — Encounter: Payer: Self-pay | Admitting: Internal Medicine

## 2016-02-26 ENCOUNTER — Ambulatory Visit
Admission: RE | Admit: 2016-02-26 | Discharge: 2016-02-26 | Disposition: A | Discharge status: Home / Self Care | Payer: Medicare Other | Source: Ambulatory Visit | Attending: Internal Medicine | Admitting: Internal Medicine

## 2016-02-26 ENCOUNTER — Telehealth: Payer: Self-pay

## 2016-02-26 VITALS — BP 120/68 | HR 60 | Temp 97.3°F | Ht 64.0 in | Wt 137.2 lb

## 2016-02-26 DIAGNOSIS — L03115 Cellulitis of right lower limb: Secondary | ICD-10-CM | POA: Diagnosis not present

## 2016-02-26 DIAGNOSIS — M25571 Pain in right ankle and joints of right foot: Secondary | ICD-10-CM

## 2016-02-26 DIAGNOSIS — L089 Local infection of the skin and subcutaneous tissue, unspecified: Secondary | ICD-10-CM | POA: Diagnosis not present

## 2016-02-26 MED ORDER — SACCHAROMYCES BOULARDII 250 MG PO CAPS: 250.0000 mg | ORAL_CAPSULE | Freq: Two times a day (BID) | ORAL | 0 refills | Status: DC

## 2016-02-26 MED ORDER — DOXYCYCLINE HYCLATE 100 MG PO TABS: 100.0000 mg | ORAL_TABLET | Freq: Two times a day (BID) | ORAL | 0 refills | Status: DC

## 2016-02-26 NOTE — Telephone Encounter (Signed)
Spoke with patient per Dr. Montez Moritaarter she has a bone infection in her toe and she wants her to continue her antibiotic. She will have a appt. To see Manxie Oct. 12 2017 in the clinic, a nurse will come and see her to check on her toe. Patient expressed understanding.

## 2016-02-26 NOTE — Patient Instructions (Signed)
Will call with xray results  Take all of antibiotic even if feeling better  Keep right foot elevated when seated  Change bandage over toe daily. Clean with Dial antibacterial soap and warm water.   Follow up as scheduled with Dr Laveda AbbeGreen/ManXie Mast, NP
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
## 2016-02-29 LAB — WOUND CULTURE: GRAM STAIN: NONE SEEN

## 2016-03-03 ENCOUNTER — Encounter: Payer: Self-pay | Admitting: Internal Medicine

## 2016-03-05 ENCOUNTER — Ambulatory Visit: Payer: Medicare Other | Admitting: Nurse Practitioner

## 2016-03-05 ENCOUNTER — Non-Acute Institutional Stay: Payer: Medicare Other | Admitting: Internal Medicine

## 2016-03-05 ENCOUNTER — Encounter: Payer: Self-pay | Admitting: Internal Medicine

## 2016-03-05 VITALS — BP 110/62 | HR 61 | Temp 97.9°F | Ht 64.0 in | Wt 133.0 lb

## 2016-03-05 DIAGNOSIS — L089 Local infection of the skin and subcutaneous tissue, unspecified: Secondary | ICD-10-CM

## 2016-03-05 DIAGNOSIS — M1A9XX1 Chronic gout, unspecified, with tophus (tophi): Secondary | ICD-10-CM | POA: Diagnosis not present

## 2016-03-05 DIAGNOSIS — S91301A Unspecified open wound, right foot, initial encounter: Secondary | ICD-10-CM | POA: Diagnosis not present

## 2016-03-05 DIAGNOSIS — S91309A Unspecified open wound, unspecified foot, initial encounter: Secondary | ICD-10-CM | POA: Insufficient documentation

## 2016-03-05 MED ORDER — ALLOPURINOL 300 MG PO TABS: 300.0000 mg | ORAL_TABLET | Freq: Every day | ORAL | 6 refills | Status: AC

## 2016-03-05 MED ORDER — SILVER SULFADIAZINE 1 % EX CREA: TOPICAL_CREAM | CUTANEOUS | 3 refills | Status: DC
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
## 2016-03-09 ENCOUNTER — Encounter: Payer: Self-pay | Admitting: Internal Medicine

## 2016-03-09 ENCOUNTER — Non-Acute Institutional Stay: Payer: Medicare Other | Admitting: Internal Medicine

## 2016-03-09 DIAGNOSIS — L089 Local infection of the skin and subcutaneous tissue, unspecified: Secondary | ICD-10-CM

## 2016-03-09 DIAGNOSIS — M1A9XX1 Chronic gout, unspecified, with tophus (tophi): Secondary | ICD-10-CM | POA: Diagnosis not present

## 2016-03-09 MED ORDER — DOXYCYCLINE HYCLATE 100 MG PO TABS: ORAL_TABLET | ORAL | 0 refills | Status: DC

## 2016-03-09 NOTE — Progress Notes (Signed)
Progress Note    Location:  [facility] Room Number: [phone] Place of Service:  ALF [phone]) Provider:  Murray Hodgkins, MD  Patient Care Team: [name], MD as PCP - General (Internal Medicine) Ollen Gross, MD as Consulting Physician (Orthopedic Surgery) Janalyn Harder, MD as Consulting Physician (Dermatology) Tonny Bollman, MD as Consulting Physician (Cardiology) Terrial Rhodes, MD as Consulting Physician (Nephrology) Antonietta Barcelona, OD as Consulting Physician (Optometry)  Extended Emergency Contact Information Primary Emergency Contact: Delanna Ahmadi [place] Phone: [phone] Relation: Son Secondary Emergency Contact: Pulliam,Martha Address: [address]          [place], [place] [address] [place] of [place] Home Phone: [phone] Mobile Phone: [phone] Relation: Daughter  Code Status:  Full Code Goals of care: Advanced Directive information Advanced Directives 03/09/2016  Does patient have an advance directive? Yes  Type of Estate agent of [place];Living will  Does patient want to make changes to advanced directive? -  Copy of advanced directive(s) in chart? Yes     Chief Complaint  Patient presents with  . Acute Visit    evaluate 2nd toe on right foot    HPI:  Pt is a 82 y.o. female seen today for an acute visit for follow up of infected toe. Patient has been moved to infirmary area of [facility] at [facility]. She is supposed to be getting daily soaks of he left toe, which has culture proven MRSA and tophaceous gout. The right 2nd toe is still very red and tender. She should continue the application of SSD cream after her daily soaks in warm water. She was taking doxycycline until 03/07/16.   Past Medical History:  Diagnosis Date  . Arthritis    Gout  . Atherosclerosis of renal artery (HCC)   . Carotid bruit 1998   negative Doppler  . Coronary atherosclerosis of native  coronary artery 2008   ACS; Dr Excell Seltzer  . Essential hypertension, benign   . Pure hypercholesterolemia   . Renal artery stenosis (HCC)   . Thyroid disease    Past Surgical History:  Procedure Laterality Date  . cardiac catherization  2008   cardiac cath and abdominal aortogram performed 08/30/2006  . CATARACT EXTRACTION    . PARTIAL HIP ARTHROPLASTY  2004  . Total abdominal hysterectomy with USO     Dr Shea Evans, for fibroids  . VARICOSE VEIN SURGERY  1978    No Known Allergies    Medication List       Accurate as of 03/09/16  3:51 PM. Always use your most recent med list.          allopurinol 300 MG tablet Commonly known as:  ZYLOPRIM Take 1 tablet (300 mg total) by mouth daily.   amLODipine 10 MG tablet Commonly known as:  NORVASC Take 10 mg by mouth. Take 1/2 tablet daily for blood pressure   CALTRATE 600 PLUS-VIT D PO Take 1 tablet by mouth every evening.   colchicine 0.6 MG tablet Take 0.6 mg by mouth daily.   furosemide 40 MG tablet Commonly known as:  LASIX TAKE 1 BY MOUTH DAILY   levothyroxine 112 MCG tablet Commonly known as:  SYNTHROID, LEVOTHROID Take one tablet daily on Monday through Friday   multivitamin capsule Take 1 capsule by mouth every evening.   silver sulfADIAZINE 1 % cream Commonly known as:  SILVADENE Cleanse wound daily then apply cream to the wound and cover with gauze   traMADol  50 MG tablet Commonly known as:  ULTRAM Take by mouth. Take 50 mg daily as needed for pain; Take 1/2 tablet (25 mg) twice daily       Review of Systems  Constitutional: Negative for chills, diaphoresis and fever.  HENT: Positive for hearing loss. Negative for congestion, ear discharge, ear pain, nosebleeds and tinnitus.   Eyes: Negative for photophobia, pain, discharge and redness.  Respiratory: Negative for cough, shortness of breath and stridor.   Cardiovascular: Positive for leg swelling. Negative for chest pain and palpitations.  Gastrointestinal:  Negative for abdominal pain, blood in stool, constipation, diarrhea, nausea and vomiting.  Endocrine: Negative for polydipsia.  Genitourinary: Positive for frequency. Negative for dysuria, flank pain, hematuria and urgency.       1-2x/night  Musculoskeletal: Negative for back pain, myalgias and neck pain.       Left hip pain, not a surgical candidate, s/p right hip replacement. Right hand the 2nd MCP enlarged, redness, painful chronic.   Skin: Negative for rash.       Top of the rightt 2nd toe has large open wound that is tender and has large amount uric crystal deposit.  Allergic/Immunologic: Negative for environmental allergies.  Neurological: Negative for dizziness, tremors, seizures, weakness and headaches.  Hematological: Does not bruise/bleed easily.  Psychiatric/Behavioral: Positive for sleep disturbance. Negative for hallucinations and suicidal ideas. The patient is not nervous/anxious.        Not sleeping well due to left hip pain.     Immunization History  Administered Date(s) Administered  . Influenza Whole 03/01/2008, 04/12/2009  . Influenza, High Dose Seasonal PF 02/22/2014  . Influenza,inj,Quad PF,6-35 Mos 01/23/2013  . Influenza-Unspecified 01/24/2015  . PPD Test 07/27/2012  . Pneumococcal Conjugate-13 12/19/2014  . Tdap 07/27/2012   Pertinent  Health Maintenance Due  Topic Date Due  . PNA vac Low Risk Adult (2 of 2 - PPSV23) 12/19/2015  . INFLUENZA VACCINE  12/24/2015  . DEXA SCAN  Completed   Fall Risk  02/26/2016 09/26/2015 06/20/2015 06/20/2014 07/27/2012  Falls in the past year? No No No No No   Functional Status Survey:    Vitals:   03/09/16 1532  BP: (!) 148/70  Pulse: 66  Resp: 18  Temp: 98.1 F (36.7 C)  Weight: 133 lb (60.3 kg)  Height: 5\' 4"  (1.626 m)   Body mass index is 22.83 kg/m. Physical Exam  Constitutional: She is oriented to person, place, and time. She appears well-developed and well-nourished. No distress.  Looks comfortable and in NAD    HENT:  Right Ear: External ear normal.  Left Ear: External ear normal.  Nose: Nose normal.  Mouth/Throat: Oropharynx is clear and moist. No oropharyngeal exudate.  Eyes: Conjunctivae and EOM are normal. Pupils are equal, round, and reactive to light. No scleral icterus.  Neck: No JVD present. No tracheal deviation present. No thyromegaly present.  Cardiovascular: Normal rate, regular rhythm, normal heart sounds and intact distal pulses.  Exam reveals no gallop and no friction rub.   No murmur heard. +1 pitting right foot edema. DP/PT pulse intact b/l. No LLE edema  Pulmonary/Chest: Effort normal. No respiratory distress. She has no wheezes. She has no rales. She exhibits no tenderness.  Abdominal: She exhibits no distension and no mass. There is no tenderness.  Musculoskeletal: Normal range of motion. She exhibits edema, tenderness and deformity.  Lymphadenopathy:    She has no cervical adenopathy.  Neurological: She is alert and oriented to person, place, and time. No cranial  nerve deficit. Coordination normal.  Skin: Skin is warm and dry. Rash noted. She is not diaphoretic. There is erythema (right dorsal distal foot). No pallor.  Right distal foot with increased redness, swelling and TTP at 2nd and 3rd MTP joint. Reduced ROM MTP joint 2-3. (+) TTP at right 2nd toe PIP joint. Open sore noted with purulent d/c.  Psychiatric: She has a normal mood and affect. Her behavior is normal. Judgment and thought content normal.    Labs reviewed:  Recent Labs  09/19/15  NA 143  K 3.9  BUN 34*  CREATININE 1.6*    Recent Labs  09/19/15  AST 19  ALT 9  ALKPHOS 94    Recent Labs  09/19/15  WBC 5.9  HGB 11.8*  HCT 36  PLT 243   Lab Results  Component Value Date   TSH 3.50 12/24/2015   No results found for: HGBA1C Lab Results  Component Value Date   CHOL 130 02/17/2013   HDL 51 02/17/2013   LDLCALC 60 02/17/2013   LDLDIRECT 122.7 08/08/2010   TRIG 96 02/17/2013   CHOLHDL 3  11/03/2012    Significant Diagnostic Results in last 30 days:  Dg Foot Complete Right  Result Date: 02/26/2016 CLINICAL DATA:  Plantar calcaneal enthesophyte. EXAM: RIGHT FOOT COMPLETE - 3+ VIEW COMPARISON:  Right ankle radiographs from 11/18/2012 FINDINGS: The osseous elements of the right foot are demineralized and osteopenic in appearance. There is soft tissue swelling over the dorsum of the foot. There is also soft tissue swelling of the second toe with what appear to be bony sequestra involving the middle phalanx. Soft tissue calcifications are noted over the dorsum of the right second toe. Findings are concerning for osteomyelitis. Joint space narrowing of the third through fifth DIP and PIP joints, interphalangeal joint of the great toe and first MTP articulations are seen. Tiny subcortical erosions along the lateral base of the first MTP joint. Small phlebolith is suggested accounting for a calcified soft tissue density along the plantar aspect of the midfoot. Cornuate shaped navicular bone consistent with a type 3 navicular. IMPRESSION: Soft tissue swelling of the right second toe with bone destruction of the middle phalanx in a pattern concerning for osteomyelitis. There appear to be adjacent bony sequestra and calcifications at the level of the middle phalanx. Stable plantar calcaneal enthesophyte. Electronically Signed   By: Tollie Eth M.D.   On: 02/26/2016 14:06    Assessment/Plan 1. Toe infection Resumed antibiotic due to persistence of redness and likleihood of persistent infection. - doxycycline (VIBRA-TABS) 100 MG tablet; One twice daily to treat infection  Dispense: 40 tablet; Refill: 0  2. Tophaceous gout - continue allopurinol and colchicine and daily soaks.

## 2016-03-11 ENCOUNTER — Encounter: Payer: Self-pay | Admitting: Nurse Practitioner

## 2016-03-11 NOTE — Progress Notes (Signed)
Location:  [facility] Room Number: [phone] Place of Service:  ALF [phone](13) Provider:  Mast, Manxie  NP  Murray HodgkinsArthur Green, MD  Patient Care Team: Kimber RelicArthur G Green, MD as PCP - General (Internal Medicine) Ollen GrossFrank Aluisio, MD as Consulting Physician (Orthopedic Surgery) Janalyn HarderStuart Tafeen, MD as Consulting Physician (Dermatology) Tonny BollmanMichael Cooper, MD as Consulting Physician (Cardiology) Terrial RhodesJoseph Coladonato, MD as Consulting Physician (Nephrology) Antonietta Barcelonaharles David Miller, OD as Consulting Physician (Optometry) Man Johnney OuX Mast, NP as Nurse Practitioner (Internal Medicine)  Extended Emergency Contact Information Primary Emergency Contact: Delanna AhmadiSteed,Michael  [place] Mobile Phone: [phone] Relation: Son Secondary Emergency Contact: Pulliam,Martha Address: [address]          [place], [place] [id number] [place] Home Phone: [phone] Mobile Phone: [phone] Relation: Daughter  Code Status:  Full Code Goals of care: Advanced Directive information Advanced Directives 03/11/2016  Does patient have an advance directive? Yes  Type of Estate agentAdvance Directive Healthcare Power of [place];Living will  Does patient want to make changes to advanced directive? No - Patient declined  Copy of advanced directive(s) in chart? Yes     Chief Complaint  Patient presents with  . Acute Visit    Rt toe wound with MRSA    HPI:  Pt is a 82 y.o. female seen today for an acute visit for    Past Medical History:  Diagnosis Date  . Arthritis    Gout  . Atherosclerosis of renal artery (HCC)   . Carotid bruit 1998   negative Doppler  . Coronary atherosclerosis of native coronary artery 2008   ACS; Dr Excell Seltzerooper  . Essential hypertension, benign   . Pure hypercholesterolemia   . Renal artery stenosis (HCC)   . Thyroid disease    Past Surgical History:  Procedure Laterality Date  . cardiac catherization  2008   cardiac cath and abdominal aortogram performed 08/30/2006    . CATARACT EXTRACTION    . PARTIAL HIP ARTHROPLASTY  2004  . Total abdominal hysterectomy with USO     Dr Shea Evansunn, for fibroids  . VARICOSE VEIN SURGERY  1978    No Known Allergies    Medication List       Accurate as of 03/11/16 10:14 AM. Always use your most recent med list.          allopurinol 300 MG tablet Commonly known as:  ZYLOPRIM Take 1 tablet (300 mg total) by mouth daily.   amLODipine 10 MG tablet Commonly known as:  NORVASC Take 10 mg by mouth. Take 1/2 tablet daily for blood pressure   CALTRATE 600 PLUS-VIT D PO Take 1 tablet by mouth every evening.   colchicine 0.6 MG tablet Take 0.6 mg by mouth daily.   doxycycline 100 MG tablet Commonly known as:  VIBRA-TABS One twice daily to treat infection   furosemide 40 MG tablet Commonly known as:  LASIX TAKE 1 BY MOUTH DAILY   levothyroxine 112 MCG tablet Commonly known as:  SYNTHROID, LEVOTHROID Take one tablet daily on Monday through Friday   multivitamin capsule Take 1 capsule by mouth every evening.   saccharomyces boulardii 250 MG capsule Commonly known as:  FLORASTOR Take 250 mg by mouth 2 (two) times daily. While on antibiotics.   silver sulfADIAZINE 1 % cream Commonly known as:  SILVADENE Cleanse wound daily then apply cream to the wound and cover with gauze   traMADol 50 MG tablet Commonly known as:  ULTRAM Take by mouth. Take 50 mg daily  as needed for pain; Take 1/2 tablet (25 mg) twice daily       Review of Systems  Immunization History  Administered Date(s) Administered  . Influenza Whole 03/01/2008, 04/12/2009  . Influenza, High Dose Seasonal PF 02/22/2014  . Influenza,inj,Quad PF,6-35 Mos 01/23/2013  . Influenza-Unspecified 01/24/2015  . PPD Test 07/27/2012  . Pneumococcal Conjugate-13 12/19/2014  . Tdap 07/27/2012   Pertinent  Health Maintenance Due  Topic Date Due  . PNA vac Low Risk Adult (2 of 2 - PPSV23) 12/19/2015  . INFLUENZA VACCINE  12/24/2015  . DEXA SCAN   Completed   Fall Risk  02/26/2016 09/26/2015 06/20/2015 06/20/2014 07/27/2012  Falls in the past year? No No No No No   Functional Status Survey:    Vitals:   03/11/16 1005  BP: (!) 148/70  Pulse: 66  Resp: 18  Temp: 97.3 F (36.3 C)  Weight: 133 lb (60.3 kg)  Height: 5\' 4"  (1.626 m)   Body mass index is 22.83 kg/m. Physical Exam  Labs reviewed:  Recent Labs  09/19/15  NA 143  K 3.9  BUN 34*  CREATININE 1.6*    Recent Labs  09/19/15  AST 19  ALT 9  ALKPHOS 94    Recent Labs  09/19/15  WBC 5.9  HGB 11.8*  HCT 36  PLT 243   Lab Results  Component Value Date   TSH 3.50 12/24/2015   No results found for: HGBA1C Lab Results  Component Value Date   CHOL 130 02/17/2013   HDL 51 02/17/2013   LDLCALC 60 02/17/2013   LDLDIRECT 122.7 08/08/2010   TRIG 96 02/17/2013   CHOLHDL 3 11/03/2012    Significant Diagnostic Results in last 30 days:  Dg Foot Complete Right  Result Date: 02/26/2016 CLINICAL DATA:  Plantar calcaneal enthesophyte. EXAM: RIGHT FOOT COMPLETE - 3+ VIEW COMPARISON:  Right ankle radiographs from 11/18/2012 FINDINGS: The osseous elements of the right foot are demineralized and osteopenic in appearance. There is soft tissue swelling over the dorsum of the foot. There is also soft tissue swelling of the second toe with what appear to be bony sequestra involving the middle phalanx. Soft tissue calcifications are noted over the dorsum of the right second toe. Findings are concerning for osteomyelitis. Joint space narrowing of the third through fifth DIP and PIP joints, interphalangeal joint of the great toe and first MTP articulations are seen. Tiny subcortical erosions along the lateral base of the first MTP joint. Small phlebolith is suggested accounting for a calcified soft tissue density along the plantar aspect of the midfoot. Cornuate shaped navicular bone consistent with a type 3 navicular. IMPRESSION: Soft tissue swelling of the right second toe with  bone destruction of the middle phalanx in a pattern concerning for osteomyelitis. There appear to be adjacent bony sequestra and calcifications at the level of the middle phalanx. Stable plantar calcaneal enthesophyte. Electronically Signed   By: Tollie Eth M.D.   On: 02/26/2016 14:06    Assessment/Plan There are no diagnoses linked to this encounter.   Family/ staff Communication:   Labs/tests ordered:    This encounter was created in error - please disregard.

## 2016-03-19 ENCOUNTER — Non-Acute Institutional Stay: Payer: Medicare Other | Admitting: Internal Medicine

## 2016-03-19 ENCOUNTER — Encounter: Payer: Self-pay | Admitting: Internal Medicine

## 2016-03-19 VITALS — BP 138/70 | HR 76 | Temp 98.0°F | Ht 64.0 in | Wt 129.0 lb

## 2016-03-19 DIAGNOSIS — L089 Local infection of the skin and subcutaneous tissue, unspecified: Secondary | ICD-10-CM

## 2016-03-19 DIAGNOSIS — M1A9XX1 Chronic gout, unspecified, with tophus (tophi): Secondary | ICD-10-CM | POA: Diagnosis not present

## 2016-03-19 DIAGNOSIS — R110 Nausea: Secondary | ICD-10-CM | POA: Insufficient documentation

## 2016-03-19 NOTE — Progress Notes (Signed)
[facility] Room Number: AL [phone]  Place of Service: Clinic (12)     No Known Allergies  Chief Complaint  Patient presents with  . Medical Management of Chronic Issues    2 week check on wound right foot 2nd toe    HPI:  Toe infection - Less erythema. No bone exposure. Tophi are open.   Nausea - states she is unable to eat much due to nausea that she thinks is caused by the doxycycline.  Tophaceous gout - large tophi of the infected toe    Medications: Patient's Medications  New Prescriptions   No medications on file  Previous Medications   ACETAMINOPHEN (TYLENOL) 325 MG TABLET    Take 650 mg by mouth. Take 2 tablets as needed minor pain   ALLOPURINOL (ZYLOPRIM) 300 MG TABLET    Take 1 tablet (300 mg total) by mouth daily.   AMLODIPINE (NORVASC) 10 MG TABLET    Take 10 mg by mouth. Take 1/2 tablet daily for blood pressure   COLCHICINE 0.6 MG TABLET    Take 0.6 mg by mouth daily.   FUROSEMIDE (LASIX) 40 MG TABLET    TAKE 1 BY MOUTH DAILY   LEVOTHYROXINE (SYNTHROID, LEVOTHROID) 112 MCG TABLET    Take one tablet daily on Monday through Friday   SILVER SULFADIAZINE (SILVADENE) 1 % CREAM    Cleanse wound daily then apply cream to the wound and cover with gauze   TRAMADOL (ULTRAM) 50 MG TABLET    Take by mouth. Take 50 mg daily as needed for pain; Take 1/2 tablet (25 mg) twice daily  Modified Medications   No medications on file  Discontinued Medications   CALCIUM-VITAMIN D (CALTRATE 600 PLUS-VIT D PO)    Take 1 tablet by mouth every evening.    DOXYCYCLINE (VIBRA-TABS) 100 MG TABLET    One twice daily to treat infection   MULTIPLE VITAMIN (MULTIVITAMIN) CAPSULE    Take 1 capsule by mouth every evening.    SACCHAROMYCES BOULARDII (FLORASTOR) 250 MG CAPSULE    Take 250 mg by mouth 2 (two) times daily. While on antibiotics.     Review of Systems  Constitutional: Negative for chills, diaphoresis and fever.  HENT: Positive for hearing loss. Negative for  congestion, ear discharge, ear pain, nosebleeds and tinnitus.   Eyes: Negative for photophobia, pain, discharge and redness.  Respiratory: Negative for cough, shortness of breath and stridor.   Cardiovascular: Positive for leg swelling. Negative for chest pain and palpitations.  Gastrointestinal: Positive for nausea. Negative for abdominal pain, blood in stool, constipation, diarrhea and vomiting.  Endocrine: Negative for polydipsia.  Genitourinary: Positive for frequency. Negative for dysuria, flank pain, hematuria and urgency.       1-2x/night  Musculoskeletal: Negative for back pain, myalgias and neck pain.       Left hip pain, not a surgical candidate, s/p right hip replacement. Right hand the 2nd MCP enlarged, redness, painful chronic.   Skin: Negative for rash.       Top of the right 2nd toe has large open wound that is tender and has large amount uric crystal deposit.  Allergic/Immunologic: Negative for environmental allergies.  Neurological: Negative for dizziness, tremors, seizures, weakness and headaches.  Hematological: Does not bruise/bleed easily.  Psychiatric/Behavioral: Positive for sleep disturbance. Negative for hallucinations and suicidal ideas. The patient is not nervous/anxious.        Not sleeping well due to left hip pain.  Vitals:   03/19/16 1318  BP: 138/70  Pulse: 76  Temp: 98 F (36.7 C)  TempSrc: Oral  SpO2: 97%  Weight: 129 lb (58.5 kg)  Height: 5\' 4"  (1.626 m)   Wt Readings from Last 3 Encounters:  03/19/16 129 lb (58.5 kg)  03/11/16 133 lb (60.3 kg)  03/09/16 133 lb (60.3 kg)    Body mass index is 22.14 kg/m.  Physical Exam  Constitutional: She is oriented to person, place, and time. She appears well-developed and well-nourished. No distress.  Looks comfortable and in NAD  HENT:  Right Ear: External ear normal.  Left Ear: External ear normal.  Nose: Nose normal.  Mouth/Throat: Oropharynx is clear and moist. No oropharyngeal exudate.    Eyes: Conjunctivae and EOM are normal. Pupils are equal, round, and reactive to light. No scleral icterus.  Neck: No JVD present. No tracheal deviation present. No thyromegaly present.  Cardiovascular: Normal rate, regular rhythm, normal heart sounds and intact distal pulses.  Exam reveals no gallop and no friction rub.   No murmur heard. +1 pitting right foot edema. DP/PT pulse intact b/l. No LLE edema  Pulmonary/Chest: Effort normal. No respiratory distress. She has no wheezes. She has no rales. She exhibits no tenderness.  Abdominal: She exhibits no distension and no mass. There is no tenderness.  Musculoskeletal: Normal range of motion. She exhibits edema, tenderness and deformity.  Lymphadenopathy:    She has no cervical adenopathy.  Neurological: She is alert and oriented to person, place, and time. No cranial nerve deficit. Coordination normal.  Skin: Skin is warm and dry. Rash noted. She is not diaphoretic. There is erythema (right dorsal distal foot). No pallor.  Right distal foot with increased redness, swelling and TTP at 2nd and 3rd MTP joint. Reduced ROM MTP joint 2-3. (+) TTP at right 2nd toe PIP joint. Open sore noted with purulent d/c.  Psychiatric: She has a normal mood and affect. Her behavior is normal. Judgment and thought content normal.     Labs reviewed: Lab Summary Latest Ref Rng & Units 09/19/2015 01/30/2015 12/19/2014  Hemoglobin 12.0 - 16.0 g/dL 11.8(A) 12.2 12.5  Hematocrit 36 - 46 % 36 36.5 37.6  White count 10:3/mL 5.9 10.0 7.5  Platelet count 150 - 399 K/L 243 239.0 277.0  Sodium 137 - 147 mmol/L 143 (None) (None)  Potassium 3.4 - 5.3 mmol/L 3.9 (None) (None)  Calcium - (None) (None) (None)  Phosphorus - (None) (None) (None)  Creatinine 0.5 - 1.1 mg/dL 12/21/2014) (None) (None)  AST 13 - 35 U/L 19 (None) (None)  Alk Phos 25 - 125 U/L 94 (None) (None)  Bilirubin - (None) (None) (None)  Glucose mg/dL 79 (None) (None)  Cholesterol - (None) (None) (None)  HDL  cholesterol - (None) (None) (None)  Triglycerides - (None) (None) (None)  LDL Direct - (None) (None) (None)  LDL Calc - (None) (None) (None)  Total protein - (None) (None) (None)  Albumin - (None) (None) (None)  Some recent data might be hidden   Lab Results  Component Value Date   TSH 3.50 12/24/2015   Lab Results  Component Value Date   BUN 34 (A) 09/19/2015   BUN 44 (H) 02/22/2014   BUN 31 (H) 02/01/2014   Lab Results  Component Value Date   CREATININE 1.6 (A) 09/19/2015   CREATININE 2.5 (H) 02/22/2014   CREATININE 2.1 (H) 02/01/2014   No results found for: HGBA1C     Assessment/Plan 1. Toe infection -switch doxy to  Septra DS bid x 7 days CBC, CMP  2. Tophaceous gout Unchanged -uric acid  3. Nausea without vomiting changed antibiotic.

## 2016-03-23 IMAGING — CR DG HAND COMPLETE 3+V*R*
3 series · 3 of 3 positions shown · non-contrast
Comparison: None.

CLINICAL DATA: Right hand pain and swelling for 10 days involving
first second and third fingers with no known injury, history of gout

EXAM:
RIGHT HAND - COMPLETE 3+ VIEW

[view not recorded (1 of 3)]
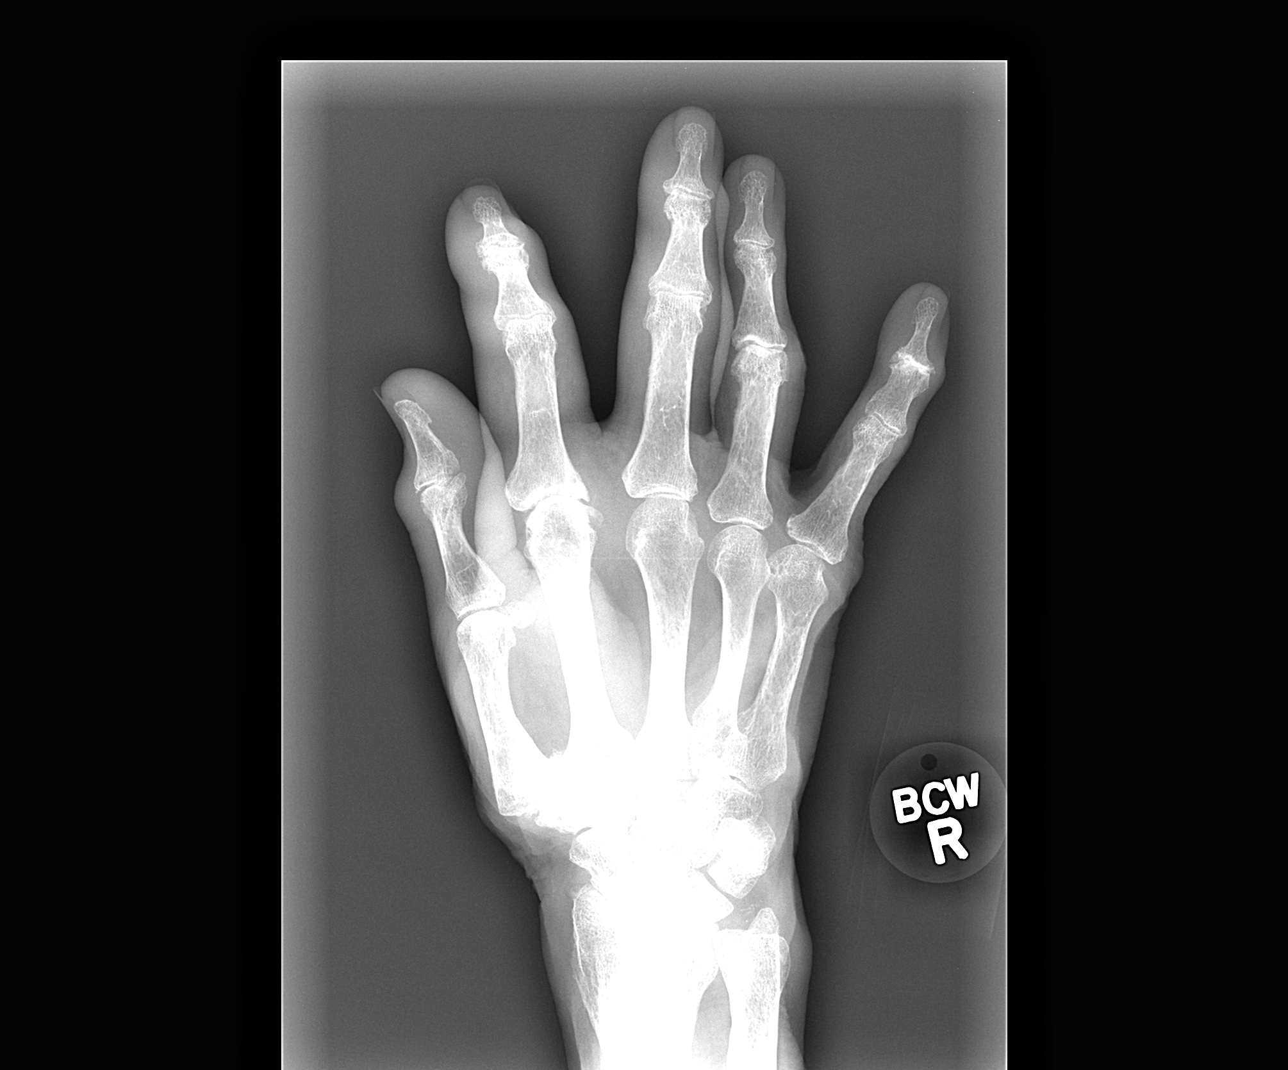

[view not recorded (2 of 3)]
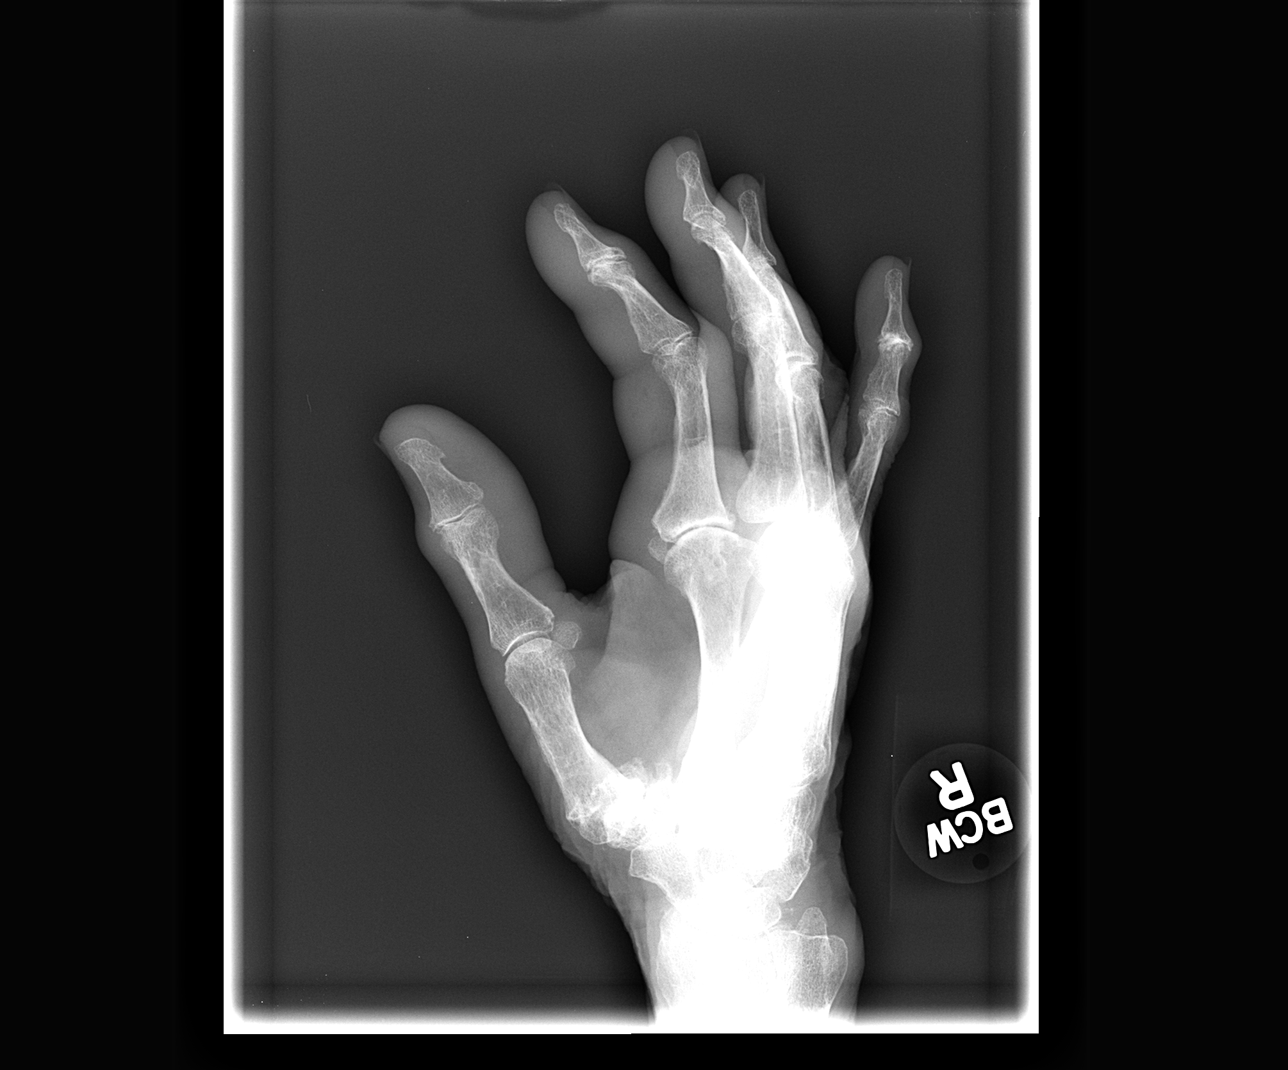

[view not recorded (3 of 3)]
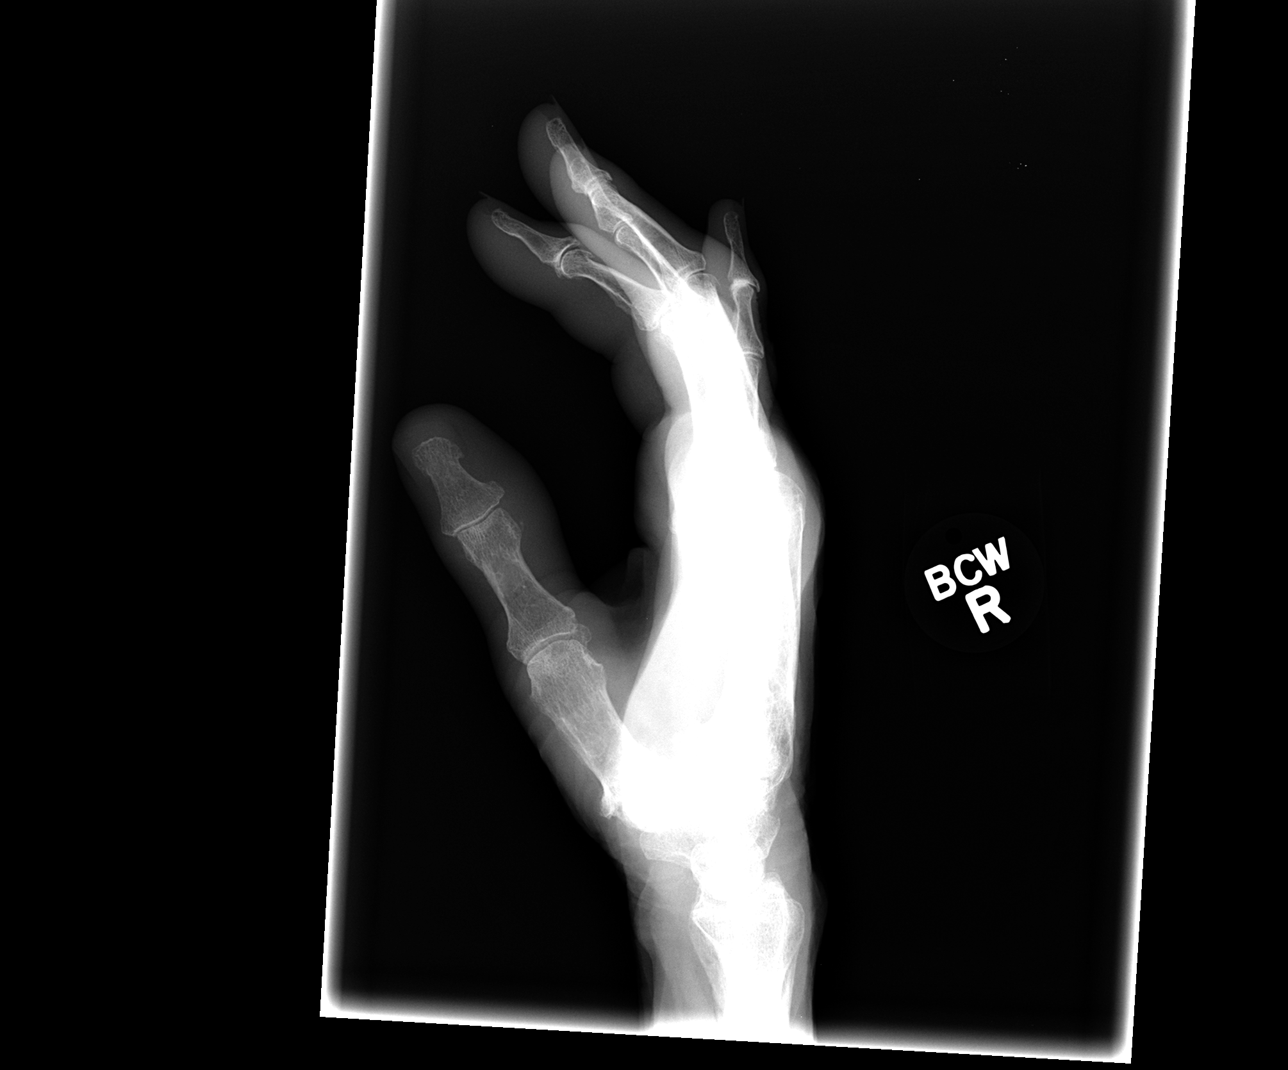

[3 of 3 positions shown; findings below may reference images not displayed]

FINDINGS: Diffuse osteopenia. Diffuse soft tissue swelling first second and
third digits.

Severe arthritic change of the first carpal metacarpal joint.

The second metacarpal phalangeal joint shows degenerative change
with overhanging edges. This in combination with the soft tissue
swelling suggests the possibility of gouty arthritis.

More nonspecific arthritic change of the distal interphalangeal
joints throughout the digits noted.
IMPRESSION: In addition to findings consistent with osteoarthritis, soft-tissue
swelling of the first second and third digits with pattern of
degenerative change involving the second MCP joint suggests the
possibility of gouty arthritis as well.

## 2016-03-24 LAB — BASIC METABOLIC PANEL
BUN: 40 mg/dL — AB (ref 4–21)
Creatinine: 2.5 mg/dL — AB (ref <=1.1)
GLUCOSE: 73 mg/dL
POTASSIUM: 4.2 mmol/L (ref 3.4–5.3)
SODIUM: 139 mmol/L (ref 137–147)

## 2016-03-24 LAB — CBC AND DIFFERENTIAL
HCT: 35 % — AB (ref 36–46)
Hemoglobin: 11.3 g/dL — AB (ref 12.0–16.0)
Platelets: 160 10*3/uL (ref 150–399)
WBC: 6.1 10*3/mL

## 2016-03-24 LAB — HEPATIC FUNCTION PANEL
ALT: 18 U/L (ref 7–35)
AST: 33 U/L (ref 13–35)
Alkaline Phosphatase: 102 U/L (ref 25–125)
BILIRUBIN, TOTAL: 0.6 mg/dL

## 2016-03-25 ENCOUNTER — Other Ambulatory Visit: Payer: Self-pay | Admitting: *Deleted

## 2016-03-25 ENCOUNTER — Encounter: Payer: Self-pay | Admitting: Nurse Practitioner

## 2016-04-02 ENCOUNTER — Encounter: Payer: Self-pay | Admitting: Internal Medicine

## 2016-04-02 ENCOUNTER — Non-Acute Institutional Stay: Payer: Medicare Other | Admitting: Internal Medicine

## 2016-04-02 VITALS — BP 130/62 | HR 68 | Temp 97.7°F | Ht 64.0 in | Wt 133.0 lb

## 2016-04-02 DIAGNOSIS — L089 Local infection of the skin and subcutaneous tissue, unspecified: Secondary | ICD-10-CM | POA: Diagnosis not present

## 2016-04-02 DIAGNOSIS — N184 Chronic kidney disease, stage 4 (severe): Secondary | ICD-10-CM | POA: Diagnosis not present

## 2016-04-02 DIAGNOSIS — I10 Essential (primary) hypertension: Secondary | ICD-10-CM

## 2016-04-02 DIAGNOSIS — M1A9XX1 Chronic gout, unspecified, with tophus (tophi): Secondary | ICD-10-CM | POA: Diagnosis not present

## 2016-04-02 DIAGNOSIS — D649 Anemia, unspecified: Secondary | ICD-10-CM | POA: Diagnosis not present

## 2016-04-02 LAB — BASIC METABOLIC PANEL
BUN: 28 mg/dL — AB (ref 4–21)
Creatinine: 1.6 mg/dL — AB (ref 0.5–1.1)
GLUCOSE: 80 mg/dL
POTASSIUM: 4.1 mmol/L (ref 3.4–5.3)
Sodium: 138 mmol/L (ref 137–147)

## 2016-04-02 NOTE — Progress Notes (Addendum)
  Facility  [facility] Room Number: AL920  Place of Service: Clinic (12)     No Known Allergies  Chief Complaint  Patient presents with  . Medical Management of Chronic Issues    2 week follow-up on right foot.  Here with daughter Heather Morrison    HPI:   Toe infection - appears resolved.  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  Anemia, unspecified type - mild  Chronic renal insufficiency, stage IV (severe) (HCC) - chronic and unchanged  Essential hypertension - controlled    Medications: Patient's Medications  New Prescriptions   No medications on file  Previous Medications   ACETAMINOPHEN (TYLENOL) 325 MG TABLET    Take 650 mg by mouth. Take 2 tablets as needed minor pain   ALLOPURINOL (ZYLOPRIM) 300 MG TABLET    Take 1 tablet (300 mg total) by mouth daily.   AMLODIPINE (NORVASC) 10 MG TABLET    Take 10 mg by mouth. Take 1/2 tablet daily for blood pressure   COLCHICINE 0.6 MG TABLET    Take 0.6 mg by mouth daily.   FUROSEMIDE (LASIX) 40 MG TABLET    TAKE 1 BY MOUTH DAILY   LEVOTHYROXINE (SYNTHROID, LEVOTHROID) 112 MCG TABLET    Take one tablet daily on Monday through Friday   PROMETHAZINE (PHENERGAN) 12.5 MG TABLET    Take one every 6 hours as needed for nausea   SILVER SULFADIAZINE (SILVADENE) 1 % CREAM    Cleanse wound daily then apply cream to the wound and cover with gauze   TRAMADOL (ULTRAM) 50 MG TABLET    Take by mouth. Take 50 mg daily as needed for pain; Take 1/2 tablet (25 mg) twice daily  Modified Medications   No medications on file  Discontinued Medications   No medications on file     Review of Systems  Constitutional: Negative for chills, diaphoresis and fever.  HENT: Positive for hearing loss. Negative for congestion, ear discharge, ear pain, nosebleeds and tinnitus.   Eyes: Negative for photophobia, pain, discharge and redness.  Respiratory: Negative for cough,  shortness of breath and stridor.   Cardiovascular: Positive for leg swelling. Negative for chest pain and palpitations.  Gastrointestinal: Positive for nausea. Negative for abdominal pain, blood in stool, constipation, diarrhea and vomiting.  Endocrine: Negative for polydipsia.  Genitourinary: Positive for frequency. Negative for dysuria, flank pain, hematuria and urgency.       1-2x/night  Musculoskeletal: Negative for back pain, myalgias and neck pain.       Left hip pain, not a surgical candidate, s/p right hip replacement. Right hand the 2nd MCP enlarged, redness, painful chronic.   Skin: Negative for rash.       Top of the right 2nd toe has large open wound that is tender and has large amount uric crystal deposit.  Allergic/Immunologic: Negative for environmental allergies.  Neurological: Negative for dizziness, tremors, seizures, weakness and headaches.  Hematological: Does not bruise/bleed easily.  Psychiatric/Behavioral: Positive for sleep disturbance. Negative for hallucinations and suicidal ideas. The patient is not nervous/anxious.        Not sleeping well due to left hip pain.     Vitals:   04/02/16 1403  BP: 130/62  Pulse: 68  Temp: 97.7 F (36.5 C)  TempSrc: Oral  SpO2: 97%  Weight: 133 lb (60.3 kg)  Height: 5' 4" (1.626 m)   Wt Readings from   Last 3 Encounters:  04/02/16 133 lb (60.3 kg)  03/19/16 129 lb (58.5 kg)  03/11/16 133 lb (60.3 kg)    Body mass index is 22.83 kg/m.  Physical Exam  Constitutional: She is oriented to person, place, and time. She appears well-developed and well-nourished. No distress.  Looks comfortable and in NAD  HENT:  Right Ear: External ear normal.  Left Ear: External ear normal.  Nose: Nose normal.  Mouth/Throat: Oropharynx is clear and moist. No oropharyngeal exudate.  Eyes: Conjunctivae and EOM are normal. Pupils are equal, round, and reactive to light. No scleral icterus.  Neck: No JVD present. No tracheal deviation present.  No thyromegaly present.  Cardiovascular: Normal rate, regular rhythm, normal heart sounds and intact distal pulses.  Exam reveals no gallop and no friction rub.   No murmur heard. +1 pitting right foot edema. DP/PT pulse intact b/l. No LLE edema  Pulmonary/Chest: Effort normal. No respiratory distress. She has no wheezes. She has no rales. She exhibits no tenderness.  Abdominal: She exhibits no distension and no mass. There is no tenderness.  Musculoskeletal: Normal range of motion. She exhibits edema, tenderness and deformity.  Lymphadenopathy:    She has no cervical adenopathy.  Neurological: She is alert and oriented to person, place, and time. No cranial nerve deficit. Coordination normal.  Skin: Skin is warm and dry. Rash noted. She is not diaphoretic. There is erythema (right dorsal distal foot). No pallor.  Improvement in the erythema and swelling. Infection appears resolved. Patient in agreement with referral to podiatry for amputation of the right 2nd toe due to chronic open tophi and high risk of relapse of the infection.  Psychiatric: She has a normal mood and affect. Her behavior is normal. Judgment and thought content normal.     Labs reviewed: Lab Summary Latest Ref Rng & Units 03/24/2016 09/19/2015 01/30/2015 12/19/2014  Hemoglobin 12.0 - 16.0 g/dL 11.3(A) 11.8(A) 12.2 12.5  Hematocrit 36 - 46 % 35(A) 36 36.5 37.6  White count 10:3/mL 6.1 5.9 10.0 7.5  Platelet count 150 - 399 K/L 160 243 239.0 277.0  Sodium 137 - 147 mmol/L 139 143 (None) (None)  Potassium 3.4 - 5.3 mmol/L 4.2 3.9 (None) (None)  Calcium - (None) (None) (None) (None)  Phosphorus - (None) (None) (None) (None)  Creatinine 0.5 - 1.1 mg/dL 2.5(A) 1.6(A) (None) (None)  AST 13 - 35 U/L 33 19 (None) (None)  Alk Phos 25 - 125 U/L 102 94 (None) (None)  Bilirubin - (None) (None) (None) (None)  Glucose mg/dL 73 79 (None) (None)  Cholesterol - (None) (None) (None) (None)  HDL cholesterol - (None) (None) (None) (None)    Triglycerides - (None) (None) (None) (None)  LDL Direct - (None) (None) (None) (None)  LDL Calc - (None) (None) (None) (None)  Total protein - (None) (None) (None) (None)  Albumin - (None) (None) (None) (None)  Some recent data might be hidden   Lab Results  Component Value Date   TSH 3.50 12/24/2015   Lab Results  Component Value Date   BUN 40 (A) 03/24/2016   BUN 34 (A) 09/19/2015   BUN 44 (H) 02/22/2014   Lab Results  Component Value Date   CREATININE 2.5 (A) 03/24/2016   CREATININE 1.6 (A) 09/19/2015   CREATININE 2.5 (H) 02/22/2014   No results found for: HGBA1C     Assessment/Plan  1. Toe infection Appears resolved - Ambulatory referral to Podiatry for amputation of the right 2nd toe  2. Tophaceous gout -  Ambulatory referral to Podiatry for amputation of the right 2nd toe. -Confirm with her daughter: Fernand Parkins [phone]  3. Anemia, unspecified type mild  4. Chronic renal insufficiency, stage IV (severe) (HCC) unchanged  5. Essential hypertension controlled

## 2016-04-08 ENCOUNTER — Other Ambulatory Visit: Payer: Self-pay | Admitting: *Deleted

## 2016-04-08 MED ORDER — TRAMADOL HCL 50 MG PO TABS: ORAL_TABLET | ORAL | 0 refills | Status: AC

## 2016-04-08 NOTE — Telephone Encounter (Signed)
[facility] [phone] Fax: [phone]  

## 2016-04-13 ENCOUNTER — Ambulatory Visit (INDEPENDENT_AMBULATORY_CARE_PROVIDER_SITE_OTHER): Payer: Medicare Other

## 2016-04-13 ENCOUNTER — Encounter: Payer: Self-pay | Admitting: Podiatry

## 2016-04-13 ENCOUNTER — Ambulatory Visit (INDEPENDENT_AMBULATORY_CARE_PROVIDER_SITE_OTHER): Payer: Medicare Other | Admitting: Podiatry

## 2016-04-13 DIAGNOSIS — M1000 Idiopathic gout, unspecified site: Secondary | ICD-10-CM | POA: Diagnosis not present

## 2016-04-13 DIAGNOSIS — M79674 Pain in right toe(s): Secondary | ICD-10-CM

## 2016-04-13 DIAGNOSIS — L97511 Non-pressure chronic ulcer of other part of right foot limited to breakdown of skin: Secondary | ICD-10-CM

## 2016-04-13 NOTE — Progress Notes (Signed)
Subjective:     Patient ID: Heather LambEvelyn S Brickle, female   DOB: September 19, 1916, 82 y.o.   MRN: [id number]  HPI patient presents with her daughter with a chronic lesion on top of the second toe right and also a history of trauma with a motorized wheelchair running over the toe   Review of Systems  All other systems reviewed and are negative.      Objective:   Physical Exam  Constitutional: She is oriented to person, place, and time.  Cardiovascular: Intact distal pulses.   Musculoskeletal: Normal range of motion.  Neurological: She is oriented to person, place, and time.  Skin: Skin is warm and dry.  Nursing note and vitals reviewed.  vascular status is found to be diminished but intact with neurological status intact for a lady of her age. Diminished range of motion of the subtalar midtarsal joint was noted with patient found to have edema in the lower legs bilateral which the patient's caregiver states is a periodic event. Patient has a crusted area on the interphalangeal joint right second toe localized in nature that does not give her significant pain and is currently being treated at her home with a nurse providing daily changes. It has not gotten worse and she's on an antibiotic that they're not sure as to what it is that she's now able to tolerate     Assessment:     Superficial ulceration distal second digit right which also may have a relationship to gout or may have a relationship to localized infection process with no systemic indications of infection or no proximal infection    Plan:     H&P x-ray performed. Debrided tissue which remains superficial and applied Iodosorb with sterile dressing and instructed on daily changes for this. I do think ultimately this may require amputation but as long as remains local and it's not bothersome to her and she's able to get around and keep it under control we will just watch this and see where this goes. She does though understand that she may  ultimately require amputation and there may be other issues that occurs with this process  X-ray report indicates there is significant loss of bone of the middle phalanx right second toe with also tophaceous deposits around the area

## 2016-04-15 ENCOUNTER — Non-Acute Institutional Stay: Payer: Medicare Other | Admitting: Nurse Practitioner

## 2016-04-15 ENCOUNTER — Encounter: Payer: Self-pay | Admitting: Nurse Practitioner

## 2016-04-15 ENCOUNTER — Telehealth: Payer: Self-pay | Admitting: *Deleted

## 2016-04-15 DIAGNOSIS — E039 Hypothyroidism, unspecified: Secondary | ICD-10-CM | POA: Diagnosis not present

## 2016-04-15 DIAGNOSIS — K219 Gastro-esophageal reflux disease without esophagitis: Secondary | ICD-10-CM

## 2016-04-15 DIAGNOSIS — I739 Peripheral vascular disease, unspecified: Secondary | ICD-10-CM

## 2016-04-15 DIAGNOSIS — R609 Edema, unspecified: Secondary | ICD-10-CM | POA: Diagnosis not present

## 2016-04-15 DIAGNOSIS — M150 Primary generalized (osteo)arthritis: Secondary | ICD-10-CM

## 2016-04-15 DIAGNOSIS — I10 Essential (primary) hypertension: Secondary | ICD-10-CM

## 2016-04-15 DIAGNOSIS — M159 Polyosteoarthritis, unspecified: Secondary | ICD-10-CM

## 2016-04-15 DIAGNOSIS — D500 Iron deficiency anemia secondary to blood loss (chronic): Secondary | ICD-10-CM

## 2016-04-15 DIAGNOSIS — N184 Chronic kidney disease, stage 4 (severe): Secondary | ICD-10-CM | POA: Diagnosis not present

## 2016-04-15 DIAGNOSIS — M1A9XX1 Chronic gout, unspecified, with tophus (tophi): Secondary | ICD-10-CM

## 2016-04-15 NOTE — Assessment & Plan Note (Signed)
Stable, TED, chronic, hx of wound, continue Furosemide

## 2016-04-15 NOTE — Progress Notes (Signed)
Location:  [facility] Room Number: [id number] Place of Service:  ALF [phone]) Provider:  Aishani Kalis, Manxie  NP  Murray Hodgkins, MD  Patient Care Team: [name], MD as PCP - General (Internal Medicine) Ollen Gross, MD as Consulting Physician (Orthopedic Surgery) Janalyn Harder, MD as Consulting Physician (Dermatology) Tonny Bollman, MD as Consulting Physician (Cardiology) Terrial Rhodes, MD as Consulting Physician (Nephrology) Antonietta Barcelona, OD as Consulting Physician (Optometry) Ely Ballen Johnney Ou, NP as Nurse Practitioner (Internal Medicine)  Extended Emergency Contact Information Primary Emergency Contact: Delanna Ahmadi [place] of [place] Phone: [phone] Relation: Son Secondary Emergency Contact: Pulliam,Martha Address: [address]          [place], [place] [address] [place] of [place] Home Phone: [phone] Mobile Phone: [phone] Relation: Daughter  Code Status:  Full Code Goals of care: Advanced Directive information Advanced Directives 04/15/2016  Does Patient Have a Medical Advance Directive? Yes  Type of Estate agent of [place];Living will  Does patient want to make changes to medical advance directive? -  Copy of Healthcare Power of Attorney in Chart? Yes     Chief Complaint  Patient presents with  . Acute Visit    Edema to both legs    HPI:  Pt is a 82 y.o. female seen today for an acute visit for BLE edema, denied increased SOB, cough, sputum production, or chest discomfort. Taking Furosemide 40mg  daily.     Hx of HTN, controlled on Amlodipine 10mg , Furosemide 40mg . Takes Levothyroxine , TSH  3.5 12/24/15,  CKD Bun34, creat 1.6 09/19/15, 28/1/1.6 04/02/16 . Uric acid 4.7 12/24/15, taking Colchicine 0.6mg  and Uloric 40mg  daily.      Past Medical History:  Diagnosis Date  . Arthritis    Gout  . Atherosclerosis of renal artery (HCC)   . Carotid bruit 1998   negative Doppler  .  Coronary atherosclerosis of native coronary artery 2008   ACS; Dr Excell Seltzer  . Essential hypertension, benign   . Pure hypercholesterolemia   . Renal artery stenosis (HCC)   . Thyroid disease    Past Surgical History:  Procedure Laterality Date  . cardiac catherization  2008   cardiac cath and abdominal aortogram performed 08/30/2006  . CATARACT EXTRACTION    . PARTIAL HIP ARTHROPLASTY  2004  . Total abdominal hysterectomy with USO     Dr Shea Evans, for fibroids  . VARICOSE VEIN SURGERY  1978    No Known Allergies    Medication List       Accurate as of 04/15/16  3:06 PM. Always use your most recent med list.          acetaminophen 325 MG tablet Commonly known as:  TYLENOL Take 650 mg by mouth. Take 2 tablets as needed minor pain   allopurinol 300 MG tablet Commonly known as:  ZYLOPRIM Take 1 tablet (300 mg total) by mouth daily.   amLODipine 10 MG tablet Commonly known as:  NORVASC Take 10 mg by mouth. Take 1/2 tablet daily for blood pressure   furosemide 40 MG tablet Commonly known as:  LASIX TAKE 1 BY MOUTH DAILY   levothyroxine 112 MCG tablet Commonly known as:  SYNTHROID, LEVOTHROID Take one tablet daily on Monday through Friday   traMADol 50 MG tablet Commonly known as:  ULTRAM Take 1/2 (25mg ) tablet by mouth twice daily; Take two (1/2 tablets)=50mg   by mouth once daily as needed for pain       Review of Systems  Constitutional: Negative for chills, diaphoresis and fever.  HENT: Positive for hearing loss. Negative for congestion, ear discharge, ear pain, nosebleeds and tinnitus.   Eyes: Negative for photophobia, pain, discharge and redness.  Respiratory: Negative for cough, shortness of breath and stridor.   Cardiovascular: Positive for leg swelling. Negative for chest pain and palpitations.  Gastrointestinal: Positive for nausea. Negative for abdominal pain, blood in stool, constipation, diarrhea and vomiting.  Endocrine: Negative for polydipsia.    Genitourinary: Positive for frequency. Negative for dysuria, flank pain, hematuria and urgency.       1-2x/night  Musculoskeletal: Negative for back pain, myalgias and neck pain.       Left hip pain, not a surgical candidate, s/p right hip replacement. Right hand the 2nd MCP enlarged, redness, painful chronic.   Skin: Negative for rash.       Top of the right 2nd toe has large open wound that is tender and has large amount uric crystal deposit.  Allergic/Immunologic: Negative for environmental allergies.  Neurological: Negative for dizziness, tremors, seizures, weakness and headaches.  Hematological: Does not bruise/bleed easily.  Psychiatric/Behavioral: Positive for sleep disturbance. Negative for hallucinations and suicidal ideas. The patient is not nervous/anxious.        Not sleeping well due to left hip pain.     Immunization History  Administered Date(s) Administered  . Influenza Whole 03/01/2008, 04/12/2009  . Influenza, High Dose Seasonal PF 02/22/2014  . Influenza,inj,Quad PF,6-35 Mos 01/23/2013  . Influenza-Unspecified 01/24/2015, 03/05/2016  . PPD Test 07/27/2012  . Pneumococcal Conjugate-13 12/19/2014  . Tdap 07/27/2012   Pertinent  Health Maintenance Due  Topic Date Due  . PNA vac Low Risk Adult (2 of 2 - PPSV23) 12/19/2015  . INFLUENZA VACCINE  Completed  . DEXA SCAN  Completed   Fall Risk  02/26/2016 09/26/2015 06/20/2015 06/20/2014 07/27/2012  Falls in the past year? No No No No No   Functional Status Survey:    Vitals:   04/15/16 1310  BP: 120/60  Pulse: 62  Resp: 18  Temp: 97.8 F (36.6 C)  Weight: 133 lb (60.3 kg)  Height: 5\' 4"  (1.626 m)   Body mass index is 22.83 kg/m. Physical Exam  Constitutional: She is oriented to person, place, and time. She appears well-developed and well-nourished. No distress.  Looks comfortable and in NAD  HENT:  Right Ear: External ear normal.  Left Ear: External ear normal.  Nose: Nose normal.  Mouth/Throat: Oropharynx  is clear and moist. No oropharyngeal exudate.  Eyes: Conjunctivae and EOM are normal. Pupils are equal, round, and reactive to light. No scleral icterus.  Neck: No JVD present. No tracheal deviation present. No thyromegaly present.  Cardiovascular: Normal rate, regular rhythm, normal heart sounds and intact distal pulses.  Exam reveals no gallop and no friction rub.   No murmur heard. +1 pitting right foot edema. DP/PT pulse intact b/l. No LLE edema  Pulmonary/Chest: Effort normal. No respiratory distress. She has no wheezes. She has no rales. She exhibits no tenderness.  Abdominal: She exhibits no distension and no mass. There is no tenderness.  Musculoskeletal: Normal range of motion. She exhibits edema, tenderness and deformity.  Lymphadenopathy:    She has no cervical adenopathy.  Neurological: She is alert and oriented to person, place, and time. No cranial nerve deficit. Coordination normal.  Skin: Skin is warm and dry. Rash noted. She is not diaphoretic. There is erythema (right dorsal distal foot). No pallor.  Improvement in the erythema and swelling. Infection  appears resolved. Patient in agreement with referral to podiatry for amputation of the right 2nd toe due to chronic open tophi and high risk of relapse of the infection.  Psychiatric: She has a normal mood and affect. Her behavior is normal. Judgment and thought content normal.    Labs reviewed:  Recent Labs  09/19/15 03/24/16 04/02/16  NA 143 139 138  K 3.9 4.2 4.1  BUN 34* 40* 28*  CREATININE 1.6* 2.5* 1.6*    Recent Labs  09/19/15 03/24/16  AST 19 33  ALT 9 18  ALKPHOS 94 102    Recent Labs  09/19/15 03/24/16  WBC 5.9 6.1  HGB 11.8* 11.3*  HCT 36 35*  PLT 243 160   Lab Results  Component Value Date   TSH 3.50 12/24/2015   No results found for: HGBA1C Lab Results  Component Value Date   CHOL 130 02/17/2013   HDL 51 02/17/2013   LDLCALC 60 02/17/2013   LDLDIRECT 122.7 08/08/2010   TRIG 96 02/17/2013    CHOLHDL 3 11/03/2012    Significant Diagnostic Results in last 30 days:  No results found.  Assessment/Plan Edema BLE edema, increase Furosemide 40mg  bid x 2 days then continue Furosemide 40mg  daily, update BMP BNP     Essential hypertension Controlled, continue Amlodipine 10mg , Furosemide 40mg , 04/02/16 Na 138, K 4.1, Bun 28, creat 1.6   Peripheral vascular disease Stable, TED, chronic, hx of wound, continue Furosemide   GERD Stable.   Hypothyroidism Continue Levothyroxine 112mcg daily 12/24/15 TSH 3.5 Update TSH  Tophaceous gout Stable, continue allopurinol 300mg   Osteoarthritis Left hip pain, not a surgical candidate, s/p right hip replacement. Prn Tramadol available to her  Chronic renal insufficiency, stage IV (severe) (HCC) 04/02/16 Na 138, K 4.1, Bun 28, creat 1.6, Furosemide contributory.   Anemia 03/24/16 Hgb 11.3     Family/ staff Communication: AL  Labs/tests ordered:  BMP BNP TSH

## 2016-04-15 NOTE — Assessment & Plan Note (Signed)
Left hip pain, not a surgical candidate, s/p right hip replacement. Prn Tramadol available to her  

## 2016-04-15 NOTE — Assessment & Plan Note (Signed)
Stable, continue allopurinol 300mg 

## 2016-04-15 NOTE — Assessment & Plan Note (Signed)
Stable

## 2016-04-15 NOTE — Telephone Encounter (Signed)
Pt's dtr, Epifania GoreMartha Pulliam states pt needs a note to return to her room.  Dr Charlsie Merlesegal states pt may [facility]. Informed pt's dtr of the note. Faxed Dr. Beverlee Nimsegal's note to [facility].

## 2016-04-15 NOTE — Assessment & Plan Note (Addendum)
Continue Levothyroxine 112mcg daily 12/24/15 TSH 3.5 Update TSH

## 2016-04-15 NOTE — Assessment & Plan Note (Signed)
BLE edema, increase Furosemide 40mg  bid x 2 days then continue Furosemide 40mg  daily, update BMP BNP

## 2016-04-15 NOTE — Assessment & Plan Note (Signed)
04/02/16 Na 138, K 4.1, Bun 28, creat 1.6, Furosemide contributory.

## 2016-04-15 NOTE — Assessment & Plan Note (Signed)
Controlled, continue Amlodipine 10mg , Furosemide 40mg , 04/02/16 Na 138, K 4.1, Bun 28, creat 1.6

## 2016-04-15 NOTE — Telephone Encounter (Addendum)
[facility] states pt was seen 04/13/2016 by Dr. Charlsie Merlesegal and no orders were sent back with pt. Pt states the dressing was to stay on the foot for 2 days. [facility].

## 2016-04-15 NOTE — Assessment & Plan Note (Signed)
03/24/16 Hgb 11.3

## 2016-04-23 ENCOUNTER — Encounter: Payer: Self-pay | Admitting: Nurse Practitioner

## 2016-04-23 ENCOUNTER — Non-Acute Institutional Stay: Payer: Medicare Other | Admitting: Nurse Practitioner

## 2016-04-23 DIAGNOSIS — M2550 Pain in unspecified joint: Secondary | ICD-10-CM | POA: Diagnosis not present

## 2016-04-23 DIAGNOSIS — E039 Hypothyroidism, unspecified: Secondary | ICD-10-CM | POA: Diagnosis not present

## 2016-04-23 DIAGNOSIS — I872 Venous insufficiency (chronic) (peripheral): Secondary | ICD-10-CM

## 2016-04-23 DIAGNOSIS — I779 Disorder of arteries and arterioles, unspecified: Secondary | ICD-10-CM | POA: Diagnosis not present

## 2016-04-23 DIAGNOSIS — M159 Polyosteoarthritis, unspecified: Secondary | ICD-10-CM

## 2016-04-23 DIAGNOSIS — I10 Essential (primary) hypertension: Secondary | ICD-10-CM | POA: Diagnosis not present

## 2016-04-23 DIAGNOSIS — M1A9XX1 Chronic gout, unspecified, with tophus (tophi): Secondary | ICD-10-CM

## 2016-04-23 DIAGNOSIS — N184 Chronic kidney disease, stage 4 (severe): Secondary | ICD-10-CM

## 2016-04-23 DIAGNOSIS — I739 Peripheral vascular disease, unspecified: Secondary | ICD-10-CM | POA: Diagnosis not present

## 2016-04-23 DIAGNOSIS — R609 Edema, unspecified: Secondary | ICD-10-CM | POA: Diagnosis not present

## 2016-04-23 DIAGNOSIS — M150 Primary generalized (osteo)arthritis: Secondary | ICD-10-CM

## 2016-04-23 LAB — BASIC METABOLIC PANEL WITH GFR
BUN: 24 mg/dL — AB (ref 4–21)
Creatinine: 1.4 mg/dL — AB (ref 0.5–1.1)
Glucose: 87 mg/dL
Potassium: 3.8 mmol/L (ref 3.4–5.3)
Sodium: 140 mmol/L (ref 137–147)

## 2016-04-23 LAB — TSH: TSH: 0.09 u[IU]/mL — AB (ref 0.41–5.90)

## 2016-04-23 NOTE — Assessment & Plan Note (Signed)
continue Levothyroxine 88mcg 12/24/15 TSH 3.5

## 2016-04-23 NOTE — Assessment & Plan Note (Signed)
04/02/16 Na 138, K 4.1, Bun 28, creat 1.6 Dc Amlodipine Increase Furosemide 80mg  qd, update BMP BNP one week.

## 2016-04-23 NOTE — Assessment & Plan Note (Signed)
Left hip pain, not a surgical candidate, s/p right hip replacement. Prn Tramadol available to her  

## 2016-04-23 NOTE — Progress Notes (Signed)
Location:   [facility]   Place of Service:  Clinic (12)   Provider:  Zayla Agar, Manxie  NP  Murray HodgkinsArthur Green, MD  Patient Care Team: Kimber RelicArthur G Green, MD as PCP - General (Internal Medicine) Ollen GrossFrank Aluisio, MD as Consulting Physician (Orthopedic Surgery) Janalyn HarderStuart Tafeen, MD as Consulting Physician (Dermatology) Tonny BollmanMichael Cooper, MD as Consulting Physician (Cardiology) Terrial RhodesJoseph Coladonato, MD as Consulting Physician (Nephrology) Antonietta Barcelonaharles David Miller, OD as Consulting Physician (Optometry) Keandre Linden Johnney OuX Rubie Ficco, NP as Nurse Practitioner (Internal Medicine)  Extended Emergency Contact Information Primary Emergency Contact: Delanna AhmadiSteed,Michael  [place] Mobile Phone: [phone] Relation: Son Secondary Emergency Contact: Pulliam,Martha Address: [address]          [place], [place] [id number] [place] Home Phone: [phone] Mobile Phone: [phone] Relation: Daughter  Code Status:  Full Code Goals of care: Advanced Directive information Advanced Directives 04/23/2016  Does Patient Have a Medical Advance Directive? Yes  Type of Estate agentAdvance Directive Healthcare Power of [place];Living will  Does patient want to make changes to medical advance directive? No - Patient declined  Copy of Healthcare Power of Attorney in Chart? Yes     Chief Complaint  Patient presents with  . Acute Visit    swelling in both legs    HPI:  Pt is a 82 y.o. female seen today for an acute visit for BLE edema, denied increased SOB, cough, sputum production, or chest discomfort. Taking Furosemide 40mg  daily.     Hx of HTN, controlled on Amlodipine 10mg , Furosemide 40mg . Takes Levothyroxine 112mcg, TSH  3.5 12/24/15,  CKD Bun34, creat 1.6 09/19/15, 28/1/1.6 04/02/16, 40/2.54 03/24/16, 28/1.6 11/. Uric acid 4.7 12/24/15, taking Colchicine 0.6mg  and Uloric 40mg  daily.      Past Medical History:  Diagnosis Date  . Arthritis    Gout  . Atherosclerosis of renal artery (HCC)   . Carotid bruit 1998   negative  Doppler  . Coronary atherosclerosis of native coronary artery 2008   ACS; Dr Excell Seltzerooper  . Essential hypertension, benign   . Pure hypercholesterolemia   . Renal artery stenosis (HCC)   . Thyroid disease    Past Surgical History:  Procedure Laterality Date  . cardiac catherization  2008   cardiac cath and abdominal aortogram performed 08/30/2006  . CATARACT EXTRACTION    . PARTIAL HIP ARTHROPLASTY  2004  . Total abdominal hysterectomy with USO     Dr Shea Evansunn, for fibroids  . VARICOSE VEIN SURGERY  1978    No Known Allergies    Medication List       Accurate as of 04/23/16  4:06 PM. Always use your most recent med list.          acetaminophen 325 MG tablet Commonly known as:  TYLENOL Take 650 mg by mouth. Take 2 tablets as needed minor pain   allopurinol 300 MG tablet Commonly known as:  ZYLOPRIM Take 1 tablet (300 mg total) by mouth daily.   amLODipine 10 MG tablet Commonly known as:  NORVASC Take 10 mg by mouth. Take 1/2 tablet daily for blood pressure   furosemide 40 MG tablet Commonly known as:  LASIX TAKE 1 BY MOUTH DAILY   levothyroxine 112 MCG tablet Commonly known as:  SYNTHROID, LEVOTHROID Take one tablet daily on Monday through Friday   silver sulfADIAZINE 1 % cream Commonly known as:  SILVADENE Apply topically.   traMADol 50 MG tablet Commonly known as:  ULTRAM Take 1/2 (25mg ) tablet by mouth twice daily; Take two (1/2 tablets)=50mg   by mouth once daily as needed for pain       Review of Systems  Constitutional: Negative for chills, diaphoresis and fever.  HENT: Positive for hearing loss. Negative for congestion, ear discharge, ear pain, nosebleeds and tinnitus.   Eyes: Negative for photophobia, pain, discharge and redness.  Respiratory: Negative for cough, shortness of breath and stridor.   Cardiovascular: Positive for leg swelling. Negative for chest pain and palpitations.  Gastrointestinal: Positive for nausea. Negative for abdominal pain, blood  in stool, constipation, diarrhea and vomiting.  Endocrine: Negative for polydipsia.  Genitourinary: Positive for frequency. Negative for dysuria, flank pain, hematuria and urgency.       1-2x/night  Musculoskeletal: Negative for back pain, myalgias and neck pain.       Left hip pain, not a surgical candidate, s/p right hip replacement. Right hand the 2nd MCP enlarged, redness, painful chronic.   Skin: Negative for rash.       Top of the right 2nd toe has large open wound that is tender and has large amount uric crystal deposit.  Allergic/Immunologic: Negative for environmental allergies.  Neurological: Negative for dizziness, tremors, seizures, weakness and headaches.  Hematological: Does not bruise/bleed easily.  Psychiatric/Behavioral: Positive for sleep disturbance. Negative for hallucinations and suicidal ideas. The patient is not nervous/anxious.        Not sleeping well due to left hip pain.     Immunization History  Administered Date(s) Administered  . Influenza Whole 03/01/2008, 04/12/2009  . Influenza, High Dose Seasonal PF 02/22/2014  . Influenza,inj,Quad PF,6-35 Mos 01/23/2013  . Influenza-Unspecified 01/24/2015, 03/05/2016  . PPD Test 07/27/2012  . Pneumococcal Conjugate-13 12/19/2014  . Tdap 07/27/2012   Pertinent  Health Maintenance Due  Topic Date Due  . PNA vac Low Risk Adult (2 of 2 - PPSV23) 12/19/2015  . INFLUENZA VACCINE  Completed  . DEXA SCAN  Completed   Fall Risk  02/26/2016 09/26/2015 06/20/2015 06/20/2014 07/27/2012  Falls in the past year? No No No No No   Functional Status Survey:    Vitals:   04/23/16 1514  BP: (!) 110/58  Pulse: 65  Resp: 18  Temp: 97.4 F (36.3 C)  SpO2: 90%  Weight: 133 lb 6.4 oz (60.5 kg)  Height: 5\' 4"  (1.626 m)   Body mass index is 22.9 kg/m. Physical Exam  Constitutional: She is oriented to person, place, and time. She appears well-developed and well-nourished. No distress.  Looks comfortable and in NAD  HENT:  Right  Ear: External ear normal.  Left Ear: External ear normal.  Nose: Nose normal.  Mouth/Throat: Oropharynx is clear and moist. No oropharyngeal exudate.  Eyes: Conjunctivae and EOM are normal. Pupils are equal, round, and reactive to light. No scleral icterus.  Neck: No JVD present. No tracheal deviation present. No thyromegaly present.  Cardiovascular: Normal rate, regular rhythm, normal heart sounds and intact distal pulses.  Exam reveals no gallop and no friction rub.   No murmur heard. +1 pitting right foot edema. DP/PT pulse intact b/l. No LLE edema  Pulmonary/Chest: Effort normal. No respiratory distress. She has no wheezes. She has no rales. She exhibits no tenderness.  Abdominal: She exhibits no distension and no mass. There is no tenderness.  Musculoskeletal: Normal range of motion. She exhibits edema, tenderness and deformity.  Lymphadenopathy:    She has no cervical adenopathy.  Neurological: She is alert and oriented to person, place, and time. No cranial nerve deficit. Coordination normal.  Skin: Skin is warm and dry.  Rash noted. She is not diaphoretic. There is erythema (right dorsal distal foot). No pallor.  Improvement in the erythema and swelling. Infection appears resolved. Patient in agreement with referral to podiatry for amputation of the right 2nd toe due to chronic open tophi and high risk of relapse of the infection.  Psychiatric: She has a normal mood and affect. Her behavior is normal. Judgment and thought content normal.    Labs reviewed:  Recent Labs  09/19/15 03/24/16 04/02/16  NA 143 139 138  K 3.9 4.2 4.1  BUN 34* 40* 28*  CREATININE 1.6* 2.5* 1.6*    Recent Labs  09/19/15 03/24/16  AST 19 33  ALT 9 18  ALKPHOS 94 102    Recent Labs  09/19/15 03/24/16  WBC 5.9 6.1  HGB 11.8* 11.3*  HCT 36 35*  PLT 243 160   Lab Results  Component Value Date   TSH 3.50 12/24/2015   No results found for: HGBA1C Lab Results  Component Value Date   CHOL 130  02/17/2013   HDL 51 02/17/2013   LDLCALC 60 02/17/2013   LDLDIRECT 122.7 08/08/2010   TRIG 96 02/17/2013   CHOLHDL 3 11/03/2012    Significant Diagnostic Results in last 30 days:  No results found.  Assessment/Plan Edema BLE edema, 3+, arterial and venous insusfficiency BLE, no change of increased Furosemide 40mg  bid x 2 days then continue Furosemide 40mg  daily Will increase Furosemide to 80mg  daily, dc Amlodipine, f/u BMP BNP one week   Essential hypertension 04/02/16 Na 138, K 4.1, Bun 28, creat 1.6 Dc Amlodipine Increase Furosemide 80mg  qd, update BMP BNP one week.   Peripheral vascular disease BLE from knee down, swelling, warmth, the top of R 3rd toe open wound, the left shin wound, chronic, increase Furosemide to 80mg  daily    Peripheral arterial occlusive disease BLE, dependent rubor  Chronic venous insufficiency Swelling and open wounds  Hypothyroidism continue Levothyroxine 88mcg 12/24/15 TSH 3.5   Tophaceous gout Stable, continue allopurinol 300mg   Osteoarthritis Left hip pain, not a surgical candidate, s/p right hip replacement. Prn Tramadol available to her  Chronic renal insufficiency, stage IV (severe) (HCC) 04/02/16 Na 138, K 4.1, Bun 28, creat 1.6  Pain in joint Left hip pain is more prominent, motorized scooter to go furhter     Family/ staff Communication: AL  Labs/tests ordered: BMP BNP

## 2016-04-23 NOTE — Assessment & Plan Note (Signed)
04/02/16 Na 138, K 4.1, Bun 28, creat 1.6

## 2016-04-23 NOTE — Assessment & Plan Note (Addendum)
BLE, dependent rubor

## 2016-04-23 NOTE — Assessment & Plan Note (Signed)
BLE from knee down, swelling, warmth, the top of R 3rd toe open wound, the left shin wound, chronic, increase Furosemide to 80mg  daily

## 2016-04-23 NOTE — Assessment & Plan Note (Signed)
Left hip pain is more prominent, motorized scooter to go furhter

## 2016-04-23 NOTE — Assessment & Plan Note (Signed)
Stable, continue allopurinol 300mg 

## 2016-04-23 NOTE — Assessment & Plan Note (Signed)
Swelling and open wounds

## 2016-04-23 NOTE — Assessment & Plan Note (Signed)
BLE edema, 3+, arterial and venous insusfficiency BLE, no change of increased Furosemide 40mg  bid x 2 days then continue Furosemide 40mg  daily Will increase Furosemide to 80mg  daily, dc Amlodipine, f/u BMP BNP one week

## 2016-04-29 ENCOUNTER — Other Ambulatory Visit: Payer: Self-pay

## 2016-04-29 ENCOUNTER — Encounter: Payer: Self-pay | Admitting: Nurse Practitioner

## 2016-04-29 DIAGNOSIS — I509 Heart failure, unspecified: Secondary | ICD-10-CM | POA: Insufficient documentation

## 2016-04-29 NOTE — Progress Notes (Signed)
This encounter was created in error - please disregard.

## 2016-04-30 LAB — BASIC METABOLIC PANEL
BUN: 24 mg/dL — AB (ref 4–21)
CREATININE: 1.2 mg/dL — AB (ref <=1.1)
GLUCOSE: 95 mg/dL
POTASSIUM: 3.7 mmol/L (ref 3.4–5.3)
SODIUM: 139 mmol/L (ref 137–147)

## 2016-05-07 ENCOUNTER — Encounter: Payer: Self-pay | Admitting: Nurse Practitioner

## 2016-05-07 ENCOUNTER — Non-Acute Institutional Stay: Payer: Medicare Other | Admitting: Nurse Practitioner

## 2016-05-07 DIAGNOSIS — M1000 Idiopathic gout, unspecified site: Secondary | ICD-10-CM | POA: Diagnosis not present

## 2016-05-07 DIAGNOSIS — K219 Gastro-esophageal reflux disease without esophagitis: Secondary | ICD-10-CM | POA: Diagnosis not present

## 2016-05-07 DIAGNOSIS — I10 Essential (primary) hypertension: Secondary | ICD-10-CM

## 2016-05-07 DIAGNOSIS — I5020 Unspecified systolic (congestive) heart failure: Secondary | ICD-10-CM

## 2016-05-07 DIAGNOSIS — E039 Hypothyroidism, unspecified: Secondary | ICD-10-CM | POA: Diagnosis not present

## 2016-05-07 DIAGNOSIS — R609 Edema, unspecified: Secondary | ICD-10-CM

## 2016-05-07 DIAGNOSIS — I739 Peripheral vascular disease, unspecified: Secondary | ICD-10-CM | POA: Diagnosis not present

## 2016-05-07 NOTE — Assessment & Plan Note (Signed)
Stable, continue Allopurinol 300mg daily 

## 2016-05-07 NOTE — Assessment & Plan Note (Addendum)
04/30/16 Na 139, K 3.7, Bun 24, creat 1.23, BNP 270.0 05/07/16 improved slightly.

## 2016-05-07 NOTE — Assessment & Plan Note (Signed)
BLE from knee down, swelling, warmth, the top of R 3rd toe open wound, the left shin wound, chronic, slightly improved, cotninue Furosemide to 80mg  daily

## 2016-05-07 NOTE — Assessment & Plan Note (Signed)
Stable

## 2016-05-07 NOTE — Progress Notes (Signed)
[facility]:  [facility] Room Number: 920 Place of Service:  ALF [phone]) Provider:  Kemal Amores, Manxie  NP  Murray HodgkinsArthur Green, MD  Patient Care Team: Kimber RelicArthur G Green, MD as PCP - General (Internal Medicine) Ollen GrossFrank Aluisio, MD as Consulting Physician (Orthopedic Surgery) Janalyn HarderStuart Tafeen, MD as Consulting Physician (Dermatology) Tonny BollmanMichael Cooper, MD as Consulting Physician (Cardiology) Terrial RhodesJoseph Coladonato, MD as Consulting Physician (Nephrology) Antonietta Barcelonaharles David Miller, OD as Consulting Physician (Optometry) Varsha Knock Johnney OuX Fritzi Scripter, NP as Nurse Practitioner (Internal Medicine)  Extended Emergency Contact Information Primary Emergency Contact: Delanna AhmadiSteed,Michael  [place] Mobile Phone: [phone] Relation: Son Secondary Emergency Contact: Pulliam,Martha Address: [address]          [place], [place] [id number] [place] Home Phone: [phone] Mobile Phone: [phone] Relation: Daughter  Code Status:  Full Code Goals of care: Advanced Directive information Advanced Directives 05/07/2016  Does Patient Have a Medical Advance Directive? Yes  Type of Estate agentAdvance Directive Healthcare Power of [place];Living will  Does patient want to make changes to medical advance directive? No - Patient declined  Copy of Healthcare Power of Attorney in Chart? Yes     Chief Complaint  Patient presents with  . Medical Management of Chronic Issues    HPI:  Heather Morrison is a 82 y.o. female seen today for medical management of chronic diseases.      Hx of HTN, CHF, chronic edema BLE, controlled on Furosemide 80mg . Takes Levothyroxine 75mcg, TSH 0.09 04/23/16 , CKD Bun 24, creat 1.2 12/71/7, 28/1/1.6 04/02/16, taking Colchicine 0.6mg  and Uloric 40mg  daily.  Past Medical History:  Diagnosis Date  . Arthritis    Gout  . Atherosclerosis of renal artery (HCC)   . Carotid bruit 1998   negative Doppler  . Coronary atherosclerosis of native coronary artery 2008   ACS; Dr Excell Seltzerooper  . Essential  hypertension, benign   . Pure hypercholesterolemia   . Renal artery stenosis (HCC)   . Thyroid disease    Past Surgical History:  Procedure Laterality Date  . cardiac catherization  2008   cardiac cath and abdominal aortogram performed 08/30/2006  . CATARACT EXTRACTION    . PARTIAL HIP ARTHROPLASTY  2004  . Total abdominal hysterectomy with USO     Dr Shea Evansunn, for fibroids  . VARICOSE VEIN SURGERY  1978    No Known Allergies    Medication List       Accurate as of 05/07/16 12:18 PM. Always use your most recent med list.          allopurinol 300 MG tablet Commonly known as:  ZYLOPRIM Take 1 tablet (300 mg total) by mouth daily.   furosemide 80 MG tablet Commonly known as:  LASIX Take 80 mg by mouth daily.   levothyroxine 75 MCG tablet Commonly known as:  SYNTHROID, LEVOTHROID Take 75 mcg by mouth daily before breakfast.   neomycin-bacitracin-polymyxin ointment Commonly known as:  NEOSPORIN Apply 1 application topically as needed for wound care. apply to eye   promethazine 12.5 MG tablet Commonly known as:  PHENERGAN Take 12.5 mg by mouth every 6 (six) hours as needed for nausea or vomiting.   silver sulfADIAZINE 1 % cream Commonly known as:  SILVADENE Apply topically.   traMADol 50 MG tablet Commonly known as:  ULTRAM Take 1/2 (25mg ) tablet by mouth twice daily; Take two (1/2 tablets)=50mg   by mouth once daily as needed for pain       Review of Systems  Constitutional: Negative for chills, diaphoresis  and fever.  HENT: Positive for hearing loss. Negative for congestion, ear discharge, ear pain, nosebleeds and tinnitus.   Eyes: Negative for photophobia, pain, discharge and redness.  Respiratory: Negative for cough, shortness of breath and stridor.   Cardiovascular: Positive for leg swelling. Negative for chest pain and palpitations.  Gastrointestinal: Positive for nausea. Negative for abdominal pain, blood in stool, constipation, diarrhea and vomiting.    Endocrine: Negative for polydipsia.  Genitourinary: Positive for frequency. Negative for dysuria, flank pain, hematuria and urgency.       1-2x/night  Musculoskeletal: Positive for gait problem. Negative for back pain, myalgias and neck pain.       Left hip pain, not a surgical candidate, s/p right hip replacement. Right hand the 2nd MCP enlarged, redness, painful chronic.   Skin: Negative for rash.       Top of the right 2nd toe has large open wound that is tender and has large amount uric crystal deposit.  Allergic/Immunologic: Negative for environmental allergies.  Neurological: Negative for dizziness, tremors, seizures, weakness and headaches.  Hematological: Does not bruise/bleed easily.  Psychiatric/Behavioral: Positive for sleep disturbance. Negative for hallucinations and suicidal ideas. The patient is not nervous/anxious.        Not sleeping well due to left hip pain.     Immunization History  Administered Date(s) Administered  . Influenza Whole 03/01/2008, 04/12/2009  . Influenza, High Dose Seasonal PF 02/22/2014  . Influenza,inj,Quad PF,6-35 Mos 01/23/2013  . Influenza-Unspecified 01/24/2015, 03/05/2016  . PPD Test 07/27/2012  . Pneumococcal Conjugate-13 12/19/2014  . Tdap 07/27/2012   Pertinent  Health Maintenance Due  Topic Date Due  . PNA vac Low Risk Adult (2 of 2 - PPSV23) 12/19/2015  . INFLUENZA VACCINE  Completed  . DEXA SCAN  Completed   Fall Risk  02/26/2016 09/26/2015 06/20/2015 06/20/2014 07/27/2012  Falls in the past year? No No No No No   Functional Status Survey:    Vitals:   05/07/16 1104  BP: 118/64  Pulse: 82  Resp: 16  Temp: 98.2 F (36.8 C)  Weight: 132 lb (59.9 kg)  Height: 5\' 4"  (1.626 m)   Body mass index is 22.66 kg/m. Physical Exam  Constitutional: She is oriented to person, place, and time. She appears well-developed and well-nourished. No distress.  Looks comfortable and in NAD  HENT:  Right Ear: External ear normal.  Left Ear:  External ear normal.  Nose: Nose normal.  Mouth/Throat: Oropharynx is clear and moist. No oropharyngeal exudate.  Eyes: Conjunctivae and EOM are normal. Pupils are equal, round, and reactive to light. No scleral icterus.  Neck: No JVD present. No tracheal deviation present. No thyromegaly present.  Cardiovascular: Normal rate, regular rhythm, normal heart sounds and intact distal pulses.  Exam reveals no gallop and no friction rub.   No murmur heard. +1 pitting right foot edema. DP/Heather Morrison pulse intact b/l. No LLE edema  Pulmonary/Chest: Effort normal. No respiratory distress. She has no wheezes. She has no rales. She exhibits no tenderness.  Abdominal: She exhibits no distension and no mass. There is no tenderness.  Musculoskeletal: Normal range of motion. She exhibits edema, tenderness and deformity.  Lymphadenopathy:    She has no cervical adenopathy.  Neurological: She is alert and oriented to person, place, and time. No cranial nerve deficit. Coordination normal.  Skin: Skin is warm and dry. Rash noted. She is not diaphoretic. There is erythema (right dorsal distal foot). No pallor.  Improvement in the erythema and swelling. Infection appears  resolved. Patient in agreement with referral to podiatry for amputation of the right 2nd toe due to chronic open tophi and high risk of relapse of the infection.  Psychiatric: She has a normal mood and affect. Her behavior is normal. Judgment and thought content normal.    Labs reviewed:  Recent Labs  04/02/16 04/23/16 04/30/16  NA 138 140 139  K 4.1 3.8 3.7  BUN 28* 24* 24*  CREATININE 1.6* 1.4* 1.2*    Recent Labs  09/19/15 03/24/16  AST 19 33  ALT 9 18  ALKPHOS 94 102    Recent Labs  09/19/15 03/24/16  WBC 5.9 6.1  HGB 11.8* 11.3*  HCT 36 35*  PLT 243 160   Lab Results  Component Value Date   TSH 0.09 (A) 04/23/2016   No results found for: HGBA1C Lab Results  Component Value Date   CHOL 130 02/17/2013   HDL 51 02/17/2013    LDLCALC 60 02/17/2013   LDLDIRECT 122.7 08/08/2010   TRIG 96 02/17/2013   CHOLHDL 3 11/03/2012    Significant Diagnostic Results in last 30 days:  No results found.  Assessment/Plan Congestive heart failure (HCC) 04/23/16 Na 140, K 3.8, Bun 24, creat 1.39, BMP 340.9 04/30/16 Na 139, K 3.7, Bun 24, creat 1.23, BNP 27 Continue Furosemide 80mg .   Edema 04/30/16 Na 139, K 3.7, Bun 24, creat 1.23, BNP 270.0 05/07/16 improved slightly.      Gout Stable, continue Allopurinol 300mg  daily  Hypothyroidism continue Levothyroxine since 04/29/16 04/23/16 TSH 0.09 Repeat TSH 12 wks.     Essential hypertension Controlled, continue Furosemide 80mg  qd   PVD (peripheral vascular disease) BLE from knee down, swelling, warmth, the top of R 3rd toe open wound, the left shin wound, chronic, slightly improved, cotninue Furosemide to 80mg  daily     GERD Stable.     Family/ staff Communication: AL  Labs/tests ordered:  none

## 2016-05-07 NOTE — Assessment & Plan Note (Signed)
04/23/16 Na 140, K 3.8, Bun 24, creat 1.39, BMP 340.9 04/30/16 Na 139, K 3.7, Bun 24, creat 1.23, BNP 27 Continue Furosemide 80mg .

## 2016-05-07 NOTE — Assessment & Plan Note (Signed)
Controlled, continue Furosemide 80mg  qd

## 2016-05-07 NOTE — Assessment & Plan Note (Addendum)
continue Levothyroxine 75mcg since 04/29/16 04/23/16 TSH 0.09 Repeat TSH 12 wks.

## 2016-05-14 ENCOUNTER — Encounter: Payer: Self-pay | Admitting: Internal Medicine

## 2016-05-14 ENCOUNTER — Non-Acute Institutional Stay: Payer: Medicare Other | Admitting: Internal Medicine

## 2016-05-14 VITALS — BP 100/54 | HR 65 | Temp 98.5°F | Ht 64.0 in | Wt 133.0 lb

## 2016-05-14 DIAGNOSIS — I739 Peripheral vascular disease, unspecified: Secondary | ICD-10-CM

## 2016-05-14 DIAGNOSIS — M1A9XX1 Chronic gout, unspecified, with tophus (tophi): Secondary | ICD-10-CM

## 2016-05-14 DIAGNOSIS — L03818 Cellulitis of other sites: Secondary | ICD-10-CM | POA: Diagnosis not present

## 2016-05-14 DIAGNOSIS — L0390 Cellulitis, unspecified: Secondary | ICD-10-CM | POA: Insufficient documentation

## 2016-05-14 MED ORDER — SULFAMETHOXAZOLE-TRIMETHOPRIM 800-160 MG PO TABS: ORAL_TABLET | ORAL | 0 refills | Status: AC

## 2016-05-14 NOTE — Progress Notes (Signed)
Progress Note     Facility  [facility] Room Number: [phone]  Place of Service: Clinic (12)     No Known Allergies  Chief Complaint  Patient presents with  . Medical Management of Chronic Issues    one month check on right foot 2nd toe wound. Here with daughter Jana Half  . Hip Pain    for years, getting worse    HPI:  Legs red and tender and more swollen. No fever. Tophus is a little smaller, but is still open.  Medications: Patient's Medications  New Prescriptions   No medications on file  Previous Medications   ALLOPURINOL (ZYLOPRIM) 300 MG TABLET    Take 1 tablet (300 mg total) by mouth daily.   FUROSEMIDE (LASIX) 80 MG TABLET    Take 80 mg by mouth daily.   LEVOTHYROXINE (SYNTHROID, LEVOTHROID) 75 MCG TABLET    Take 75 mcg by mouth daily before breakfast.   NEOMYCIN-BACITRACIN-POLYMYXIN (NEOSPORIN) OINTMENT    Apply 1 application topically as needed for wound care. apply to eye   PROMETHAZINE (PHENERGAN) 12.5 MG TABLET    Take 12.5 mg by mouth every 6 (six) hours as needed for nausea or vomiting.   SILVER SULFADIAZINE (SILVADENE) 1 % CREAM    Apply topically.   TRAMADOL (ULTRAM) 50 MG TABLET    Take 1/2 ('25mg'$ ) tablet by mouth twice daily; Take two (1/2 tablets)='50mg'$   by mouth once daily as needed for pain  Modified Medications   No medications on file  Discontinued Medications   No medications on file     Review of Systems  Constitutional: Negative for chills, diaphoresis and fever.  HENT: Positive for hearing loss. Negative for congestion, ear discharge, ear pain, nosebleeds and tinnitus.   Eyes: Negative for photophobia, pain, discharge and redness.  Respiratory: Negative for cough, shortness of breath and stridor.   Cardiovascular: Positive for leg swelling. Negative for chest pain and palpitations.  Gastrointestinal: Positive for nausea. Negative for abdominal pain, blood in stool, constipation, diarrhea and vomiting.  Endocrine: Negative for  polydipsia.  Genitourinary: Positive for frequency. Negative for dysuria, flank pain, hematuria and urgency.       1-2x/night  Musculoskeletal: Negative for back pain, myalgias and neck pain.       Left hip pain, not a surgical candidate, s/p right hip replacement. Right hand the 2nd MCP enlarged, redness, painful chronic.   Skin: Negative for rash.       Top of the right 2nd toe has open wound that is tender and has uric crystal deposit. Legs are more tender.  Allergic/Immunologic: Negative for environmental allergies.  Neurological: Negative for dizziness, tremors, seizures, weakness and headaches.  Hematological: Does not bruise/bleed easily.  Psychiatric/Behavioral: Positive for sleep disturbance. Negative for hallucinations and suicidal ideas. The patient is not nervous/anxious.        Not sleeping well due to left hip pain.     Vitals:   05/14/16 1515  BP: (!) 100/54  Pulse: 65  Temp: 98.5 F (36.9 C)  SpO2: 93%  Weight: 133 lb (60.3 kg)  Height: '5\' 4"'$  (1.626 m)   Wt Readings from Last 3 Encounters:  05/14/16 133 lb (60.3 kg)  05/07/16 132 lb (59.9 kg)  04/23/16 133 lb 6.4 oz (60.5 kg)    Body mass index is 22.83 kg/m.  Physical Exam  Constitutional: She is oriented to person, place, and time. She appears well-developed and well-nourished. No distress.  Looks comfortable and in NAD  HENT:  Right Ear: External ear normal.  Left Ear: External ear normal.  Nose: Nose normal.  Mouth/Throat: Oropharynx is clear and moist. No oropharyngeal exudate.  Eyes: Conjunctivae and EOM are normal. Pupils are equal, round, and reactive to light. No scleral icterus.  Neck: No JVD present. No tracheal deviation present. No thyromegaly present.  Cardiovascular: Normal rate, regular rhythm and normal heart sounds.  Exam reveals no gallop and no friction rub.   No murmur heard. +2 pitting bilateral foot edema. Red skin Poor circulation.  Pulmonary/Chest: Effort normal. No respiratory  distress. She has no wheezes. She has no rales. She exhibits no tenderness.  Abdominal: She exhibits no distension and no mass. There is no tenderness.  Musculoskeletal: Normal range of motion. She exhibits edema, tenderness and deformity.  Lymphadenopathy:    She has no cervical adenopathy.  Neurological: She is alert and oriented to person, place, and time. No cranial nerve deficit. Coordination normal.  Skin: Skin is warm and dry. Rash noted. She is not diaphoretic. There is erythema (right dorsal distal foot). No pallor.  Worsening erythema and swelling. Infection appears relapsed.   Psychiatric: She has a normal mood and affect. Her behavior is normal. Judgment and thought content normal.     Labs reviewed: Lab Summary Latest Ref Rng & Units 04/30/2016 04/23/2016 04/02/2016 03/24/2016 09/19/2015  Hemoglobin 12.0 - 16.0 g/dL (None) (None) (None) 11.3(A) 11.8(A)  Hematocrit 36 - 46 % (None) (None) (None) 35(A) 36  White count 10:3/mL (None) (None) (None) 6.1 5.9  Platelet count 150 - 399 K/L (None) (None) (None) 160 243  Sodium 137 - 147 mmol/L 139 140 138 139 143  Potassium 3.4 - 5.3 mmol/L 3.7 3.8 4.1 4.2 3.9  Calcium - (None) (None) (None) (None) (None)  Phosphorus - (None) (None) (None) (None) (None)  Creatinine 0.5 - 1.1 mg/dL 1.2(A) 1.4(A) 1.6(A) 2.5(A) 1.6(A)  AST 13 - 35 U/L (None) (None) (None) 33 19  Alk Phos 25 - 125 U/L (None) (None) (None) 102 94  Bilirubin - (None) (None) (None) (None) (None)  Glucose mg/dL 95 87 80 73 79  Cholesterol - (None) (None) (None) (None) (None)  HDL cholesterol - (None) (None) (None) (None) (None)  Triglycerides - (None) (None) (None) (None) (None)  LDL Direct - (None) (None) (None) (None) (None)  LDL Calc - (None) (None) (None) (None) (None)  Total protein - (None) (None) (None) (None) (None)  Albumin - (None) (None) (None) (None) (None)  Some recent data might be hidden   Lab Results  Component Value Date   TSH 0.09 (A) 04/23/2016    Lab Results  Component Value Date   BUN 24 (A) 04/30/2016   BUN 24 (A) 04/23/2016   BUN 28 (A) 04/02/2016   Lab Results  Component Value Date   CREATININE 1.2 (A) 04/30/2016   CREATININE 1.4 (A) 04/23/2016   CREATININE 1.6 (A) 04/02/2016   No results found for: HGBA1C     Assessment/Plan  1. Cellulitis of other specified site -culture - sulfamethoxazole-trimethoprim (BACTRIM DS,SEPTRA DS) 800-160 MG tablet; One twice daily to treat infection  Dispense: 28 tablet; Refill: 0  2. Tophaceous gout  3. Peripheral vascular disease (HCC) ABI bilaterally. Refer to [facility].

## 2016-05-20 ENCOUNTER — Encounter: Payer: Self-pay | Admitting: Nurse Practitioner

## 2016-05-20 ENCOUNTER — Non-Acute Institutional Stay: Payer: Medicare Other | Admitting: Nurse Practitioner

## 2016-05-20 ENCOUNTER — Other Ambulatory Visit: Payer: Self-pay | Admitting: Family

## 2016-05-20 DIAGNOSIS — M1A9XX1 Chronic gout, unspecified, with tophus (tophi): Secondary | ICD-10-CM

## 2016-05-20 DIAGNOSIS — K219 Gastro-esophageal reflux disease without esophagitis: Secondary | ICD-10-CM

## 2016-05-20 DIAGNOSIS — M150 Primary generalized (osteo)arthritis: Secondary | ICD-10-CM

## 2016-05-20 DIAGNOSIS — I5020 Unspecified systolic (congestive) heart failure: Secondary | ICD-10-CM

## 2016-05-20 DIAGNOSIS — L97509 Non-pressure chronic ulcer of other part of unspecified foot with unspecified severity: Secondary | ICD-10-CM

## 2016-05-20 DIAGNOSIS — S91301A Unspecified open wound, right foot, initial encounter: Secondary | ICD-10-CM | POA: Diagnosis not present

## 2016-05-20 DIAGNOSIS — M159 Polyosteoarthritis, unspecified: Secondary | ICD-10-CM

## 2016-05-20 DIAGNOSIS — I739 Peripheral vascular disease, unspecified: Secondary | ICD-10-CM

## 2016-05-20 DIAGNOSIS — E039 Hypothyroidism, unspecified: Secondary | ICD-10-CM | POA: Diagnosis not present

## 2016-05-20 DIAGNOSIS — I10 Essential (primary) hypertension: Secondary | ICD-10-CM

## 2016-05-20 DIAGNOSIS — D500 Iron deficiency anemia secondary to blood loss (chronic): Secondary | ICD-10-CM

## 2016-05-20 DIAGNOSIS — R609 Edema, unspecified: Secondary | ICD-10-CM

## 2016-05-20 DIAGNOSIS — N184 Chronic kidney disease, stage 4 (severe): Secondary | ICD-10-CM

## 2016-05-20 NOTE — Assessment & Plan Note (Signed)
04/30/16 Na 139, K 3.7, Bun 24, creat 1.23, BNP 270.0 05/07/16 improved slightly.  Continue Furosemide

## 2016-05-20 NOTE — Assessment & Plan Note (Addendum)
Left hip pain, worse since falls in the past a few days, crepitus and pain with movement. X-ray lumbar spine, pelvis, left hip. Continue Tramadol.

## 2016-05-20 NOTE — Assessment & Plan Note (Signed)
Stable

## 2016-05-20 NOTE — Assessment & Plan Note (Signed)
04/02/16 Bun 28, creat 1.6

## 2016-05-20 NOTE — Assessment & Plan Note (Signed)
11/31/17 Hgb 11.3

## 2016-05-20 NOTE — Assessment & Plan Note (Signed)
Chronic the right 2nd toe open wound, continue allopurinol 300mg 

## 2016-05-20 NOTE — Assessment & Plan Note (Signed)
Controlled, continue Furosemide 

## 2016-05-20 NOTE — Assessment & Plan Note (Signed)
04/23/16 Na 140, K 3.8, Bun 24, creat 1.39, BMP 340.9 04/30/16 Na 139, K 3.7, Bun 24, creat 1.23, BNP 270 Continue Furosemide 80mg .

## 2016-05-20 NOTE — Assessment & Plan Note (Signed)
05/15/16 ABI prominent arterial peripheral vascular disease.

## 2016-05-20 NOTE — Assessment & Plan Note (Signed)
continue Levothyroxine 75mcg since 04/29/16, repeat TSH 12 weeks.  04/23/16 TSH 0.09

## 2016-05-20 NOTE — Progress Notes (Signed)
Location:  [facility] Room Number: [id number] Place of Service:  ALF [phone]) Provider:  Jermond Burkemper, Manxie   NP  Murray Hodgkins, MD  Patient Care Team: [name], MD as PCP - General (Internal Medicine) Ollen Gross, MD as Consulting Physician (Orthopedic Surgery) Janalyn Harder, MD as Consulting Physician (Dermatology) Tonny Bollman, MD as Consulting Physician (Cardiology) Terrial Rhodes, MD as Consulting Physician (Nephrology) Antonietta Barcelona, OD as Consulting Physician (Optometry) Mialani Reicks Johnney Ou, NP as Nurse Practitioner (Internal Medicine)  Extended Emergency Contact Information Primary Emergency Contact: Delanna Ahmadi [place] of [place] Phone: [phone] Relation: Son Secondary Emergency Contact: Pulliam,Martha Address: [address]          [place], [place] [address] [place] Home Phone: [phone] Mobile Phone: [phone] Relation: Daughter  Code Status:  Full Code  Goals of care: Advanced Directive information Advanced Directives 05/20/2016  Does Patient Have a Medical Advance Directive? Yes  Type of Estate agent of [place];Living will  Does patient want to make changes to medical advance directive? No - Patient declined  Copy of Healthcare Power of Attorney in Chart? Yes     Chief Complaint  Patient presents with  . Acute Visit    Larey Seat in the am, pain in left hip joint.    HPI:  Pt is a 82 y.o. female seen today for an acute visit for chronic left hip pain, worse since falls in the past a few days, crepitus and pain with movement.   Infected 2nd toe wound, culture showed MRSA treating with Septra DS  Hx of HTN, CHF, chronic edema BLE, controlled on Furosemide 80mg . Takes Levothyroxine , TSH 0.09 04/23/16, CKD Bun 24, creat 1.2 12/71/7, 28/1/1.6 04/02/16, taking Colchicine 0.6mg  and Uloric 40mg  daily.    Past Medical History:  Diagnosis Date  . Arthritis    Gout  .  Atherosclerosis of renal artery (HCC)   . Carotid bruit 1998   negative Doppler  . Coronary atherosclerosis of native coronary artery 2008   ACS; Dr Excell Seltzer  . Essential hypertension, benign   . Pure hypercholesterolemia   . Renal artery stenosis (HCC)   . Thyroid disease    Past Surgical History:  Procedure Laterality Date  . cardiac catherization  2008   cardiac cath and abdominal aortogram performed 08/30/2006  . CATARACT EXTRACTION    . PARTIAL HIP ARTHROPLASTY  2004  . Total abdominal hysterectomy with USO     Dr Shea Evans, for fibroids  . VARICOSE VEIN SURGERY  1978    No Known Allergies  Allergies as of 05/20/2016   No Known Allergies     Medication List       Accurate as of 05/20/16  4:06 PM. Always use your most recent med list.          allopurinol 300 MG tablet Commonly known as:  ZYLOPRIM Take 1 tablet (300 mg total) by mouth daily.   furosemide 80 MG tablet Commonly known as:  LASIX Take 80 mg by mouth daily.   levothyroxine 75 MCG tablet Commonly known as:  SYNTHROID, LEVOTHROID Take 75 mcg by mouth daily before breakfast.   neomycin-bacitracin-polymyxin ointment Commonly known as:  NEOSPORIN Apply 1 application topically as needed for wound care. apply to eye   promethazine 12.5 MG tablet Commonly known as:  PHENERGAN Take 12.5 mg by mouth every 6 (six) hours as needed for nausea or vomiting.   saccharomyces boulardii 250 MG capsule Commonly known as:  FLORASTOR Take 250 mg by mouth 2 (two) times daily.   silver sulfADIAZINE 1 % cream Commonly known as:  SILVADENE Apply topically.   sulfamethoxazole-trimethoprim 800-160 MG tablet Commonly known as:  BACTRIM DS,SEPTRA DS One twice daily to treat infection   traMADol 50 MG tablet Commonly known as:  ULTRAM Take 1/2 (25mg ) tablet by mouth twice daily; Take two (1/2 tablets)=50mg   by mouth once daily as needed for pain       Review of Systems  Constitutional: Negative for chills,  diaphoresis and fever.  HENT: Positive for hearing loss. Negative for congestion, ear discharge, ear pain, nosebleeds and tinnitus.   Eyes: Negative for photophobia, pain, discharge and redness.  Respiratory: Negative for cough, shortness of breath and stridor.   Cardiovascular: Positive for leg swelling. Negative for chest pain and palpitations.  Gastrointestinal: Positive for nausea. Negative for abdominal pain, blood in stool, constipation, diarrhea and vomiting.  Endocrine: Negative for polydipsia.  Genitourinary: Positive for frequency. Negative for dysuria, flank pain, hematuria and urgency.       1-2x/night  Musculoskeletal: Positive for arthralgias and gait problem. Negative for back pain, myalgias and neck pain.       Left hip pain, worse since falls in the past a few days, crepitus and pain with movement.  not a surgical candidate, s/p right hip replacement. Right hand the 2nd MCP enlarged, redness, painful chronic.   Skin: Negative for rash.       Top of the right 2nd toe has open wound that is tender and has uric crystal deposit. Legs are more tender.  Allergic/Immunologic: Negative for environmental allergies.  Neurological: Negative for dizziness, tremors, seizures, weakness and headaches.  Hematological: Does not bruise/bleed easily.  Psychiatric/Behavioral: Positive for sleep disturbance. Negative for hallucinations and suicidal ideas. The patient is not nervous/anxious.        Not sleeping well due to left hip pain.     Immunization History  Administered Date(s) Administered  . Influenza Whole 03/01/2008, 04/12/2009  . Influenza, High Dose Seasonal PF 02/22/2014  . Influenza,inj,Quad PF,6-35 Mos 01/23/2013  . Influenza-Unspecified 01/24/2015, 03/05/2016  . PPD Test 07/27/2012  . Pneumococcal Conjugate-13 12/19/2014  . Tdap 07/27/2012   Pertinent  Health Maintenance Due  Topic Date Due  . PNA vac Low Risk Adult (2 of 2 - PPSV23) 12/19/2015  . INFLUENZA VACCINE   Completed  . DEXA SCAN  Completed   Fall Risk  02/26/2016 09/26/2015 06/20/2015 06/20/2014 07/27/2012  Falls in the past year? No No No No No   Functional Status Survey:    Vitals:   05/20/16 1447  BP: 120/60  Pulse: 80  Resp: 16  Temp: 98 F (36.7 C)  Weight: 132 lb (59.9 kg)  Height: 5\' 4"  (1.626 m)   Body mass index is 22.66 kg/m. Physical Exam  Constitutional: She is oriented to person, place, and time. She appears well-developed and well-nourished. No distress.  Looks comfortable and in NAD  HENT:  Right Ear: External ear normal.  Left Ear: External ear normal.  Nose: Nose normal.  Mouth/Throat: Oropharynx is clear and moist. No oropharyngeal exudate.  Eyes: Conjunctivae and EOM are normal. Pupils are equal, round, and reactive to light. No scleral icterus.  Neck: No JVD present. No tracheal deviation present. No thyromegaly present.  Cardiovascular: Normal rate, regular rhythm and normal heart sounds.  Exam reveals no gallop and no friction rub.   No murmur heard. +2 pitting bilateral foot edema. Red skin Poor circulation.  Pulmonary/Chest:  Effort normal. No respiratory distress. She has no wheezes. She has no rales. She exhibits no tenderness.  Abdominal: She exhibits no distension and no mass. There is no tenderness.  Musculoskeletal: Normal range of motion. She exhibits edema, tenderness and deformity.  Left hip pain, worse since falls in the past a few days, crepitus and pain with movement.   Lymphadenopathy:    She has no cervical adenopathy.  Neurological: She is alert and oriented to person, place, and time. No cranial nerve deficit. Coordination normal.  Skin: Skin is warm and dry. Rash noted. She is not diaphoretic. There is erythema (right dorsal distal foot). No pallor.  Worsening erythema and swelling. Infection appears relapsed.   Psychiatric: She has a normal mood and affect. Her behavior is normal. Judgment and thought content normal.    Labs  reviewed:  Recent Labs  04/02/16 04/23/16 04/30/16  NA 138 140 139  K 4.1 3.8 3.7  BUN 28* 24* 24*  CREATININE 1.6* 1.4* 1.2*    Recent Labs  09/19/15 03/24/16  AST 19 33  ALT 9 18  ALKPHOS 94 102    Recent Labs  09/19/15 03/24/16  WBC 5.9 6.1  HGB 11.8* 11.3*  HCT 36 35*  PLT 243 160   Lab Results  Component Value Date   TSH 0.09 (A) 04/23/2016   No results found for: HGBA1C Lab Results  Component Value Date   CHOL 130 02/17/2013   HDL 51 02/17/2013   LDLCALC 60 02/17/2013   LDLDIRECT 122.7 08/08/2010   TRIG 96 02/17/2013   CHOLHDL 3 11/03/2012    Significant Diagnostic Results in last 30 days:  No results found.  Assessment/Plan Osteoarthritis Left hip pain, worse since falls in the past a few days, crepitus and pain with movement. X-ray lumbar spine, pelvis, left hip. Continue Tramadol.   Peripheral vascular disease (HCC) 05/15/16 ABI prominent arterial peripheral vascular disease.    Wound, open, foot with complication Right 2nd toe 05/18/16 wound culture MRSA treated with Septra DS   Essential hypertension Controlled, continue Furosemide.   Congestive heart failure (HCC) 04/23/16 Na 140, K 3.8, Bun 24, creat 1.39, BMP 340.9 04/30/16 Na 139, K 3.7, Bun 24, creat 1.23, BNP 270 Continue Furosemide 80mg .    GERD Stable.  Hypothyroidism continue Levothyroxine 75mcg since 04/29/16, repeat TSH 12 weeks.  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  Continue Furosemide     Family/ staff Communication: recommend SNF  Labs/tests ordered:  None

## 2016-05-20 NOTE — Assessment & Plan Note (Signed)
Right 2nd toe 05/18/16 wound culture MRSA treated with Septra DS

## 2016-05-21 ENCOUNTER — Non-Acute Institutional Stay (SKILLED_NURSING_FACILITY): Payer: Medicare Other | Admitting: Internal Medicine

## 2016-05-21 ENCOUNTER — Encounter: Payer: Self-pay | Admitting: Internal Medicine

## 2016-05-21 DIAGNOSIS — R609 Edema, unspecified: Secondary | ICD-10-CM | POA: Diagnosis not present

## 2016-05-21 DIAGNOSIS — I779 Disorder of arteries and arterioles, unspecified: Secondary | ICD-10-CM

## 2016-05-21 DIAGNOSIS — W19XXXD Unspecified fall, subsequent encounter: Secondary | ICD-10-CM | POA: Diagnosis not present

## 2016-05-21 DIAGNOSIS — M1A9XX1 Chronic gout, unspecified, with tophus (tophi): Secondary | ICD-10-CM

## 2016-05-21 DIAGNOSIS — I10 Essential (primary) hypertension: Secondary | ICD-10-CM

## 2016-05-21 DIAGNOSIS — E039 Hypothyroidism, unspecified: Secondary | ICD-10-CM

## 2016-05-21 DIAGNOSIS — N184 Chronic kidney disease, stage 4 (severe): Secondary | ICD-10-CM | POA: Diagnosis not present

## 2016-05-21 DIAGNOSIS — D500 Iron deficiency anemia secondary to blood loss (chronic): Secondary | ICD-10-CM

## 2016-05-21 DIAGNOSIS — W19XXXA Unspecified fall, initial encounter: Secondary | ICD-10-CM | POA: Insufficient documentation

## 2016-05-21 DIAGNOSIS — S91301A Unspecified open wound, right foot, initial encounter: Secondary | ICD-10-CM | POA: Diagnosis not present

## 2016-05-21 NOTE — Progress Notes (Signed)
History and Physical   Location:  [facility] Room Number: N15 Place of Service:  SNF (31)  PCP: Murray Hodgkins, MD Patient Care Team: [name], MD as PCP - General (Internal Medicine) Ollen Gross, MD as Consulting Physician (Orthopedic Surgery) Janalyn Harder, MD as Consulting Physician (Dermatology) Tonny Bollman, MD as Consulting Physician (Cardiology) Terrial Rhodes, MD as Consulting Physician (Nephrology) Antonietta Barcelona, OD as Consulting Physician (Optometry) Man Johnney Ou, NP as Nurse Practitioner (Internal Medicine)  Extended Emergency Contact Information Primary Emergency Contact: Delanna Ahmadi [place] of [place] Phone: [phone] Relation: Son Secondary Emergency Contact: Pulliam,Martha Address: [address]          [place], [place] [address] [place] of [place] Home Phone: [phone] Mobile Phone: [phone] Relation: Daughter  Code Status: Full code Goals of Care: Advanced Directive information Advanced Directives 05/21/2016  Does Patient Have a Medical Advance Directive? Yes  Type of Estate agent of [place];Living will  Does patient want to make changes to medical advance directive? -  Copy of Healthcare Power of Attorney in Chart? Yes      Chief Complaint  Patient presents with  . New Admit To SNF    following falls, increase need help with ADL's    HPI: Patient is a 82 y.o. female seen today for admission to [facility] SN[facility] by transfer from [facility]. She has been falling and has complained of pain in the back.  She was seen by me 1 week ago and cellulitis of the extremity was documented. She is to see [facility] on 05/29/16 for ABI. She has ulcerations of the malleoli. The erythema of the legs has improved, but they are still swollen.   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 She has a prior culture positive for MRSA.  She is admitted to the SNF due to falls and need for  wound care. She and her daughter wwere told by me that the problems of her PAOD in combination with the ulcers and cellulitis may create a situation where amputation needs to be considered.  Patient has hypothyroidism, but the TSH on 11/30/127 was low at .09  Past Medical History:  Diagnosis Date  . Arthritis    Gout  . Atherosclerosis of renal artery (HCC)   . Carotid bruit 1998   negative Doppler  . Coronary atherosclerosis of native coronary artery 2008   ACS; Dr Excell Seltzer  . Essential hypertension, benign   . Pure hypercholesterolemia   . Renal artery stenosis (HCC)   . Thyroid disease    Past Surgical History:  Procedure Laterality Date  . cardiac catherization  2008   cardiac cath and abdominal aortogram performed 08/30/2006  . CATARACT EXTRACTION    . PARTIAL HIP ARTHROPLASTY  2004  . Total abdominal hysterectomy with USO     Dr Shea Evans, for fibroids  . VARICOSE VEIN SURGERY  1978    reports that she has never smoked. She has never used smokeless tobacco. She reports that she does not drink alcohol or use drugs. Social History   Social History  . Marital status: Widowed    Spouse name: N/A  . Number of children: N/A  . Years of education: N/A   Occupational History  . retired Film/video editor    Social History Main Topics  . Smoking status: Never Smoker  . Smokeless tobacco: Never Used  . Alcohol use No  . Drug use: No  . Sexual activity: No  Other Topics Concern  . Not on file   Social History Narrative      Do you drink/eat things with caffeine? Yes- coffee      Marital status?         widow                           What year were you married? 1940      Do you live in a house, apartment, assisted living, condo, trailer, etc.? Apartment [facility]      Is it one or more stories? 7      How many persons live in your home? 1      Do you have any pets in your home? (please list) no      Current or past profession: office worker      Do you  exercise? Yes, twice daily arms and legs   POA, Living Will, DNR   Power wheelchair                 Family History  Problem Relation Age of Onset  . Hypertension Father   . Stroke Father     in 3s  . Breast cancer Daughter   . Diabetes Neg Hx   . Heart disease Neg Hx     Health Maintenance  Topic Date Due  . ZOSTAVAX  02/13/1979  . PNA vac Low Risk Adult (2 of 2 - PPSV23) 12/19/2015  . TETANUS/TDAP  07/28/2022  . INFLUENZA VACCINE  Completed  . DEXA SCAN  Completed    No Known Allergies  Allergies as of 05/21/2016   No Known Allergies     Medication List       Accurate as of 05/21/16  2:21 PM. Always use your most recent med list.          allopurinol 300 MG tablet Commonly known as:  ZYLOPRIM Take 1 tablet (300 mg total) by mouth daily.   furosemide 80 MG tablet Commonly known as:  LASIX Take 80 mg by mouth daily.   levothyroxine 75 MCG tablet Commonly known as:  SYNTHROID, LEVOTHROID Take 75 mcg by mouth daily before breakfast.   neomycin-bacitracin-polymyxin ointment Commonly known as:  NEOSPORIN Apply 1 application topically as needed for wound care. apply to eye   promethazine 12.5 MG tablet Commonly known as:  PHENERGAN Take 12.5 mg by mouth every 6 (six) hours as needed for nausea or vomiting.   saccharomyces boulardii 250 MG capsule Commonly known as:  FLORASTOR Take 250 mg by mouth 2 (two) times daily.   sulfamethoxazole-trimethoprim 800-160 MG tablet Commonly known as:  BACTRIM DS,SEPTRA DS One twice daily to treat infection   traMADol 50 MG tablet Commonly known as:  ULTRAM Take 1/2 (25mg ) tablet by mouth twice daily; Take two (1/2 tablets)=50mg   by mouth once daily as needed for pain       Review of Systems  Constitutional: Negative for chills, diaphoresis and fever.  HENT: Positive for hearing loss. Negative for congestion, ear discharge, ear pain, nosebleeds and tinnitus.   Eyes: Negative for photophobia, pain, discharge  and redness.  Respiratory: Negative for cough, shortness of breath and stridor.   Cardiovascular: Positive for leg swelling. Negative for chest pain and palpitations.  Gastrointestinal: Positive for nausea. Negative for abdominal pain, blood in stool, constipation, diarrhea and vomiting.  Endocrine: Negative for polydipsia.       Hypothroid  Genitourinary: Positive for frequency. Negative  for dysuria, flank pain, hematuria and urgency.       1-2x/night  Musculoskeletal: Positive for arthralgias and gait problem. Negative for back pain, myalgias and neck pain.       Left hip pain, worse since falls in the past a few days, crepitus and pain with movement.  not a surgical candidate, s/p right hip replacement. Right hand the 2nd MCP enlarged, redness, painful chronic.   Skin: Positive for rash.       Top of the right 2nd toe has open wound that is tender and has uric crystal deposit. Legs are more tender and are swollen. There is an erythematous rash on both legs.  Allergic/Immunologic: Negative for environmental allergies.  Neurological: Negative for dizziness, tremors, seizures, weakness and headaches.  Hematological: Does not bruise/bleed easily.  Psychiatric/Behavioral: Positive for sleep disturbance. Negative for hallucinations and suicidal ideas. The patient is not nervous/anxious.        Not sleeping well due to left hip pain.     Vitals:   05/21/16 1411  BP: (!) 162/69  Pulse: 74  Resp: 18  Temp: 99.5 F (37.5 C)  Weight: 133 lb (60.3 kg)  Height: 5\' 4"  (1.626 m)   Body mass index is 22.83 kg/m. Physical Exam  Constitutional: She is oriented to person, place, and time. She appears well-developed and well-nourished. No distress.  Looks comfortable and in NAD  HENT:  Right Ear: External ear normal.  Left Ear: External ear normal.  Nose: Nose normal.  Mouth/Throat: Oropharynx is clear and moist. No oropharyngeal exudate.  Eyes: Conjunctivae and EOM are normal. Pupils are  equal, round, and reactive to light. No scleral icterus.  Neck: No JVD present. No tracheal deviation present. No thyromegaly present.  Cardiovascular: Normal rate, regular rhythm and normal heart sounds.  Exam reveals no gallop and no friction rub.   No murmur heard. +2 pitting bilateral foot edema. Red skin Poor circulation.  Pulmonary/Chest: Effort normal. No respiratory distress. She has no wheezes. She has no rales. She exhibits no tenderness.  Abdominal: She exhibits no distension and no mass. There is no tenderness.  Musculoskeletal: Normal range of motion. She exhibits edema, tenderness and deformity.  Lymphadenopathy:    She has no cervical adenopathy.  Neurological: She is alert and oriented to person, place, and time. No cranial nerve deficit. Coordination normal.  Skin: Skin is warm and dry. Rash noted. She is not diaphoretic. There is erythema (right dorsal distal foot). No pallor.  erythema and swelling. Infection appears relapsed but seems to be responding to antibiotic.   Psychiatric: She has a normal mood and affect. Her behavior is normal. Judgment and thought content normal.    Labs reviewed: Basic Metabolic Panel:  Recent Labs  16/02/9610/09/17 04/23/16 04/30/16  NA 138 140 139  K 4.1 3.8 3.7  BUN 28* 24* 24*  CREATININE 1.6* 1.4* 1.2*   Liver Function Tests:  Recent Labs  09/19/15 03/24/16  AST 19 33  ALT 9 18  ALKPHOS 94 102   No results for input(s): LIPASE, AMYLASE in the last 8760 hours. No results for input(s): AMMONIA in the last 8760 hours. CBC:  Recent Labs  09/19/15 03/24/16  WBC 5.9 6.1  HGB 11.8* 11.3*  HCT 36 35*  PLT 243 160   Cardiac Enzymes: No results for input(s): CKTOTAL, CKMB, CKMBINDEX, TROPONINI in the last 8760 hours. BNP: Invalid input(s): POCBNP No results found for: HGBA1C Lab Results  Component Value Date   TSH 0.09 (A) 04/23/2016  No results found for: VITAMINB12 No results found for: FOLATE No results found for: IRON,  TIBC, FERRITIN  Imaging and Procedures obtained prior to SNF admission: Dg Foot Complete Right  Result Date: 02/26/2016 CLINICAL DATA:  Plantar calcaneal enthesophyte. EXAM: RIGHT FOOT COMPLETE - 3+ VIEW COMPARISON:  Right ankle radiographs from 11/18/2012 FINDINGS: The osseous elements of the right foot are demineralized and osteopenic in appearance. There is soft tissue swelling over the dorsum of the foot. There is also soft tissue swelling of the second toe with what appear to be bony sequestra involving the middle phalanx. Soft tissue calcifications are noted over the dorsum of the right second toe. Findings are concerning for osteomyelitis. Joint space narrowing of the third through fifth DIP and PIP joints, interphalangeal joint of the great toe and first MTP articulations are seen. Tiny subcortical erosions along the lateral base of the first MTP joint. Small phlebolith is suggested accounting for a calcified soft tissue density along the plantar aspect of the midfoot. Cornuate shaped navicular bone consistent with a type 3 navicular. IMPRESSION: Soft tissue swelling of the right second toe with bone destruction of the middle phalanx in a pattern concerning for osteomyelitis. There appear to be adjacent bony sequestra and calcifications at the level of the middle phalanx. Stable plantar calcaneal enthesophyte. Electronically Signed   By: Tollie Ethavid  Kwon M.D.   On: 02/26/2016 14:06    Assessment/Plan 1. Fall, subsequent encounter Unstable on standing. Weak. Using 4 wheel walker.  2. Edema, unspecified type Add spironolactone. - CMP next week  3. Peripheral arterial occlusive disease (HCC) -appt on 05/29/16 for ABI  4. Open wound of right foot with complication, initial encounter The current medical regimen is effective;  continue present plan and medications. High risk that the ulcer related to her tophaceous gout will not heal.  5. Iron deficiency anemia due to chronic blood loss Follow lab.  CBC next week  6. Chronic renal insufficiency, stage IV (severe) (HCC) Follow lab  7. Essential hypertension The current medical regimen is effective;  continue present plan and medications.  8. Tophaceous gout The current medical regimen is effective;  continue present plan and medications.  9. Hypothyroid -TSH next week

## 2016-05-26 ENCOUNTER — Encounter (HOSPITAL_COMMUNITY): Payer: Self-pay | Admitting: *Deleted

## 2016-05-26 ENCOUNTER — Observation Stay (HOSPITAL_COMMUNITY)
Admission: EM | Admit: 2016-05-26 | Discharge: 2016-06-25 | Disposition: E | Payer: Medicare Other | Attending: Family Medicine | Admitting: Family Medicine

## 2016-05-26 DIAGNOSIS — N184 Chronic kidney disease, stage 4 (severe): Secondary | ICD-10-CM | POA: Insufficient documentation

## 2016-05-26 DIAGNOSIS — I129 Hypertensive chronic kidney disease with stage 1 through stage 4 chronic kidney disease, or unspecified chronic kidney disease: Secondary | ICD-10-CM | POA: Insufficient documentation

## 2016-05-26 DIAGNOSIS — I739 Peripheral vascular disease, unspecified: Secondary | ICD-10-CM | POA: Diagnosis not present

## 2016-05-26 DIAGNOSIS — B9562 Methicillin resistant Staphylococcus aureus infection as the cause of diseases classified elsewhere: Secondary | ICD-10-CM | POA: Insufficient documentation

## 2016-05-26 DIAGNOSIS — E039 Hypothyroidism, unspecified: Secondary | ICD-10-CM | POA: Insufficient documentation

## 2016-05-26 DIAGNOSIS — R6521 Severe sepsis with septic shock: Secondary | ICD-10-CM | POA: Insufficient documentation

## 2016-05-26 DIAGNOSIS — Z96642 Presence of left artificial hip joint: Secondary | ICD-10-CM | POA: Insufficient documentation

## 2016-05-26 DIAGNOSIS — J189 Pneumonia, unspecified organism: Secondary | ICD-10-CM | POA: Insufficient documentation

## 2016-05-26 DIAGNOSIS — J9601 Acute respiratory failure with hypoxia: Secondary | ICD-10-CM | POA: Insufficient documentation

## 2016-05-26 DIAGNOSIS — A419 Sepsis, unspecified organism: Principal | ICD-10-CM | POA: Insufficient documentation

## 2016-05-26 DIAGNOSIS — J9600 Acute respiratory failure, unspecified whether with hypoxia or hypercapnia: Secondary | ICD-10-CM

## 2016-05-26 DIAGNOSIS — L989 Disorder of the skin and subcutaneous tissue, unspecified: Secondary | ICD-10-CM | POA: Diagnosis not present

## 2016-05-26 DIAGNOSIS — Z66 Do not resuscitate: Secondary | ICD-10-CM | POA: Insufficient documentation

## 2016-05-26 DIAGNOSIS — I2510 Atherosclerotic heart disease of native coronary artery without angina pectoris: Secondary | ICD-10-CM | POA: Insufficient documentation

## 2016-05-26 DIAGNOSIS — Z79899 Other long term (current) drug therapy: Secondary | ICD-10-CM | POA: Diagnosis not present

## 2016-05-26 DIAGNOSIS — Z515 Encounter for palliative care: Secondary | ICD-10-CM | POA: Diagnosis not present

## 2016-05-26 MED ORDER — ONDANSETRON 4 MG PO TBDP: 4.0000 mg | ORAL_TABLET | Freq: Four times a day (QID) | ORAL | Status: DC | PRN

## 2016-05-26 MED ORDER — ACETAMINOPHEN 650 MG RE SUPP
650.0000 mg | RECTAL | Status: DC | PRN
  Administered 2016-05-26: 650 mg via RECTAL
  Filled 2016-05-26: qty 1

## 2016-05-26 MED ORDER — SODIUM CHLORIDE 0.9% FLUSH
3.0000 mL | Freq: Two times a day (BID) | INTRAVENOUS | Status: DC
  Administered 2016-05-26: 3 mL via INTRAVENOUS

## 2016-05-26 MED ORDER — ACETAMINOPHEN 650 MG RE SUPP: 650.0000 mg | Freq: Four times a day (QID) | RECTAL | Status: DC | PRN

## 2016-05-26 MED ORDER — SODIUM CHLORIDE 0.9 % IV BOLUS (SEPSIS)
2000.0000 mL | Freq: Once | INTRAVENOUS | Status: AC
  Administered 2016-05-26: 2000 mL via INTRAVENOUS

## 2016-05-26 MED ORDER — HALOPERIDOL LACTATE 2 MG/ML PO CONC: 0.5000 mg | ORAL | Status: DC | PRN

## 2016-05-26 MED ORDER — MORPHINE SULFATE (PF) 4 MG/ML IV SOLN
4.0000 mg | Freq: Once | INTRAVENOUS | Status: AC
  Administered 2016-05-26: 4 mg via INTRAVENOUS
  Filled 2016-05-26: qty 1

## 2016-05-26 MED ORDER — ACETAMINOPHEN 325 MG PO TABS: 650.0000 mg | ORAL_TABLET | Freq: Four times a day (QID) | ORAL | Status: DC | PRN

## 2016-05-26 MED ORDER — MORPHINE SULFATE (PF) 2 MG/ML IV SOLN
1.0000 mg | INTRAVENOUS | Status: DC | PRN
  Administered 2016-05-26 – 2016-05-27 (×4): 1 mg via INTRAVENOUS
  Filled 2016-05-26 (×4): qty 1

## 2016-05-26 MED ORDER — GLYCOPYRROLATE 0.2 MG/ML IJ SOLN
0.2000 mg | INTRAMUSCULAR | Status: DC | PRN
  Filled 2016-05-26: qty 1

## 2016-05-26 MED ORDER — HALOPERIDOL 0.5 MG PO TABS
0.5000 mg | ORAL_TABLET | ORAL | Status: DC | PRN
  Filled 2016-05-26: qty 1

## 2016-05-26 MED ORDER — ONDANSETRON HCL 4 MG/2ML IJ SOLN: 4.0000 mg | Freq: Four times a day (QID) | INTRAMUSCULAR | Status: DC | PRN

## 2016-05-26 MED ORDER — MORPHINE SULFATE (PF) 4 MG/ML IV SOLN
4.0000 mg | Freq: Once | INTRAVENOUS | Status: DC
  Filled 2016-05-26: qty 1

## 2016-05-26 MED ORDER — SODIUM CHLORIDE 0.9 % IV SOLN: 250.0000 mL | INTRAVENOUS | Status: DC | PRN

## 2016-05-26 MED ORDER — SODIUM CHLORIDE 0.9% FLUSH: 3.0000 mL | INTRAVENOUS | Status: DC | PRN

## 2016-05-26 MED ORDER — BIOTENE DRY MOUTH MT LIQD: 15.0000 mL | OROMUCOSAL | Status: DC | PRN

## 2016-05-26 MED ORDER — LORAZEPAM 2 MG/ML IJ SOLN
1.0000 mg | Freq: Once | INTRAMUSCULAR | Status: AC
  Administered 2016-05-26: 1 mg via INTRAVENOUS
  Filled 2016-05-26: qty 1

## 2016-05-26 MED ORDER — HALOPERIDOL LACTATE 5 MG/ML IJ SOLN: 0.5000 mg | INTRAMUSCULAR | Status: DC | PRN

## 2016-05-26 MED ORDER — POLYVINYL ALCOHOL 1.4 % OP SOLN
1.0000 [drp] | Freq: Four times a day (QID) | OPHTHALMIC | Status: DC | PRN
  Filled 2016-05-26: qty 15

## 2016-05-26 MED ORDER — GLYCOPYRROLATE 1 MG PO TABS
1.0000 mg | ORAL_TABLET | ORAL | Status: DC | PRN
  Filled 2016-05-26: qty 1

## 2016-05-26 NOTE — ED Notes (Signed)
Hospitalist at bedside 

## 2016-05-26 NOTE — ED Notes (Addendum)
[facility] EDP spoke with pt's family has agreed to comfort measures

## 2016-05-26 NOTE — ED Notes (Signed)
Pt is not responding, eyes appears glazed over, appears to have agonal breathing.  Invountary tremors noted.

## 2016-05-26 NOTE — ED Provider Notes (Signed)
[facility]-EMERGENCY DEPT Provider Note   CSN: [id number] Arrival date & time: 2016-05-29  1312  By signing my name below, I, Heather Morrison, attest that this documentation has been prepared under the direction and in the presence of [name], MD. Electronically Signed: [name], Neurosurgeon. 05/29/16. 1:52 PM.   History   Chief Complaint No chief complaint on file.   The history is provided by the EMS personnel. The history is limited by the condition of the patient. No language interpreter was used.    LEVEL 5 CAVEAT: AMS HPI Comments: YESMIN Morrison is a 83 y.o. female brought in by ambulance from [facility], who presents to the Emergency Department with fever and SOB that began PTA. Per EMS, patient is on antibiotics for PNA and has MRSA to bilateral lower legs. Further history is limited because of her condition.   Past Medical History:  Diagnosis Date  . Arthritis    Gout  . Atherosclerosis of renal artery (HCC)   . Carotid bruit 1998   negative Doppler  . Coronary atherosclerosis of native coronary artery 2008   ACS; Dr Excell Seltzer  . Essential hypertension, benign   . Pure hypercholesterolemia   . Renal artery stenosis (HCC)   . Thyroid disease     Patient Active Problem List   Diagnosis Date Noted  . Fall 05/21/2016  . Cellulitis 05/14/2016  . Congestive heart failure (HCC) 04/29/2016  . Anemia 04/02/2016  . Tophaceous gout 03/05/2016  . Wound, open, foot with complication 03/05/2016  . Acute gout of right hand 01/30/2015  . Diplopia 12/15/2014  . Peripheral arterial occlusive disease (HCC) 03/23/2014  . Chronic venous insufficiency 03/23/2014  . PVD (peripheral vascular disease) (HCC) 02/13/2014  . Edema 02/01/2014  . Chronic renal insufficiency, stage IV (severe) (HCC) 02/01/2014  . Coronary atherosclerosis of native coronary artery 08/21/2013  . Pain in joint 01/22/2010  . CAD 04/25/2009  . RENAL ARTERY STENOSIS 04/25/2009  . GERD 04/25/2009  . Osteoarthritis  04/25/2009  . Hypothyroidism 04/17/2009  . HYPERLIPIDEMIA 04/17/2009  . Peripheral vascular disease (HCC) 10/25/2007  . OSTEOPENIA 10/25/2007  . Gout 12/01/2006  . Essential hypertension 11/15/2006    Past Surgical History:  Procedure Laterality Date  . cardiac catherization  2008   cardiac cath and abdominal aortogram performed 08/30/2006  . CATARACT EXTRACTION    . PARTIAL HIP ARTHROPLASTY  2004  . Total abdominal hysterectomy with USO     Dr Shea Evans, for fibroids  . VARICOSE VEIN SURGERY  1978    OB History    No data available       Home Medications    Prior to Admission medications   Medication Sig Start Date End Date Taking? Authorizing Provider  allopurinol (ZYLOPRIM) 300 MG tablet Take 1 tablet (300 mg total) by mouth daily. 03/05/16   [name], MD  furosemide (LASIX) 80 MG tablet Take 80 mg by mouth daily.    Historical Provider, MD  levothyroxine (SYNTHROID, LEVOTHROID) 75 MCG tablet Take 75 mcg by mouth daily before breakfast.    Historical Provider, MD  neomycin-bacitracin-polymyxin (NEOSPORIN) ointment Apply 1 application topically as needed for wound care. apply to eye    Historical Provider, MD  promethazine (PHENERGAN) 12.5 MG tablet Take 12.5 mg by mouth every 6 (six) hours as needed for nausea or vomiting.    Historical Provider, MD  saccharomyces boulardii (FLORASTOR) 250 MG capsule Take 250 mg by mouth 2 (two) times daily.    Historical Provider, MD  sulfamethoxazole-trimethoprim (BACTRIM DS,SEPTRA DS) 800-160 MG tablet One twice daily to treat infection 05/14/16   Kimber RelicArthur G Green, MD  traMADol (ULTRAM) 50 MG tablet Take 1/2 (25mg ) tablet by mouth twice daily; Take two (1/2 tablets)=50mg   by mouth once daily as needed for pain 04/08/16   Kimber RelicArthur G Green, MD    Family History Family History  Problem Relation Age of Onset  . Hypertension Father   . Stroke Father     in 8470s  . Breast cancer Daughter   . Diabetes Neg Hx   . Heart disease Neg Hx      Social History Social History  Substance Use Topics  . Smoking status: Never Smoker  . Smokeless tobacco: Never Used  . Alcohol use No     Allergies   Patient has no known allergies.   Review of Systems Review of Systems  Unable to perform ROS: Mental status change     Physical Exam Updated Vital Signs BP (!) 40/29   Pulse 110   Temp (!) 103.5 F (39.7 C) (Rectal)   Resp (!) 37   SpO2 (!) 75%   Physical Exam  Constitutional: No distress.  Frail and chronically ill appearing.   HENT:  Head: Normocephalic and atraumatic.  Eyes: EOM are normal.  Cardiovascular: Regular rhythm and normal heart sounds.   Tachycardic   Pulmonary/Chest:  Agonal breathing. Poor air movement  Abdominal: Soft. She exhibits no distension. There is no tenderness.  Neurological:  Eyes open but not responding verbally or to painful stimuli. Occasional myoclonic jerks.  Skin: Skin is dry.  Trunk is warm to touch but extremities are cold and cyanotic. Multiple wounds to lower extremities   Nursing note and vitals reviewed.    ED Treatments / Results  DIAGNOSTIC STUDIES: Oxygen Saturation is 75% on 15 L/Heather, low by my interpretation.    COORDINATION OF CARE: 1:58 PM Spoke with Epifania GoreMartha Pulliam, patient's daughter, and discussed the gravity of her mother's condition.      Labs (all labs ordered are listed, but only abnormal results are displayed) Labs Reviewed - No data to display  EKG  EKG Interpretation  Date/Time:  Tuesday May 26 2016 13:33:45 EST Ventricular Rate:  114 PR Interval:    QRS Duration: 120 QT Interval:  347 QTC Calculation: 478 R Axis:   -87 Text Interpretation:  Sinus tachycardia Artifact Confirmed by Juleen ChinaKOHUT  MD, Jeannett SeniorSTEPHEN ([id number]) on 06/24/2016 3:07:24 PM      Radiology No results found.  Procedures Procedures (including critical care time)  Medications Ordered in ED Medications - No data to display   Initial Impression / Assessment and Plan / ED  Course  I have reviewed the triage vital signs and the nursing notes.  Pertinent labs & imaging results that were available during my care of the patient were reviewed by me and considered in my medical decision making (see chart for details).  Clinical Course    83 year old female brought from nursing home for evaluation of fever and shortness of breath. She arrived to the emergency room in extremis. Clinically septic shock. Tachycardic, hyptotensive, cyanotic extremities and agonal breathing. I discussed with her daughter and explained gravity of the situation. She is critically ill and would likely die even with aggressive care. Daughter is in agreement to make Mrs. Fishbaugh comfortable as we can. I spoke with one of her brothers. She will notify the other one. Will keep IVF going and give her some morphine/ativan. Further abx or work-up deferred. Will  hold in the ED as I suspect she will pass quickly. Will admit for palliative care purposes if not.   Daughter (Preferred Contact) - Epifania Gore. [phone]  Son - Yolanda Bonine. [phone]  Son - Shermaine Rivet. [phone]  Final Clinical Impressions(s) / ED Diagnoses   Final diagnoses:  Septic shock (HCC)  End of life care    New Prescriptions New Prescriptions   No medications on file   I personally preformed the services scribed in my presence. The recorded information has been reviewed is accurate. [name], MD.    [name], MD 05/28/16 [phone]

## 2016-05-26 NOTE — ED Notes (Signed)
Bed: [id number] Expected date:  Expected time:  Means of arrival:  Comments: EMS 97, fever/poss sepsis

## 2016-05-26 NOTE — ED Triage Notes (Signed)
Per EMS, [facility] from [facility], pt is currently being treated for PNA, was sent out d/t high fever and SOB.  Hypoxic at 70s on RA.

## 2016-05-26 NOTE — Progress Notes (Signed)
Verita Lambvelyn S Suares is a 83 y.o. female patient admitted from ED asleep, not responsive - no acute distress noted.  VSS - Blood pressure (!) 147/111, pulse 98, temperature 98.8 F (37.1 C), temperature source Axillary, resp. rate (!) 36, SpO2 95 %.    IV in place, occlusive dsg intact without redness.  Orientation to room, and floor completed with information packet given to family. Admission INP armband ID verified with patient/family, and in place.   SR up x 2, fall assessment complete, with family able to verbalize understanding of risk associated with falls, and verbalized understanding to call nsg before up out of bed.  Call light within reach, with patient unable to voice, and demonstrate understanding.  Skin tears, bruising, and wounds documented.     Will cont to eval and treat per MD orders.  Elissa HeftyPatricia D Ramirez, RN 05/28/2016 11:47 PM

## 2016-05-26 NOTE — ED Notes (Signed)
Family at bedside. 

## 2016-05-26 NOTE — ED Notes (Signed)
Family remains at bedside.

## 2016-05-26 NOTE — H&P (Signed)
[facility] History and Physical  Heather Morrison UJW:[id number] DOB: Apr 13, 1917 DOA: 06/15/2016  PCP: Murray HodgkinsArthur Green, MD  Patient coming from: [facility]  Chief Complaint: Deteriorating condition  HPI: Heather Morrison is a 83 y.o. female with a medical history of recent pneumonia, MRSA lower extremity wounds, hypertension, hypothyroidism, presented to emergency department from [facility] with altered mental status, lethargy, agonal breathing and involuntary tremors. Per patient's son, she was recently diagnosed with pneumonia and placed on Levaquin. He states he saw our Sunday and noted that her health was deteriorating. Further information is limited due to patient's current condition.  ED Course: Found to have hypotension, tachycardia, tachypnea, fever. EDP spoke with patient's family, patient's family opted for comfort care. Patient given morphine in the emergency department. [facility] called for admission.  Review of Systems:  All other systems reviewed and are negative.   Past Medical History:  Diagnosis Date  . Arthritis    Gout  . Atherosclerosis of renal artery (HCC)   . Carotid bruit 1998   negative Doppler  . Coronary atherosclerosis of native coronary artery 2008   ACS; Dr Excell Seltzerooper  . Essential hypertension, benign   . Pure hypercholesterolemia   . Renal artery stenosis (HCC)   . Thyroid disease     Past Surgical History:  Procedure Laterality Date  . cardiac catherization  2008   cardiac cath and abdominal aortogram performed 08/30/2006  . CATARACT EXTRACTION    . PARTIAL HIP ARTHROPLASTY  2004  . Total abdominal hysterectomy with USO     Dr Shea Evansunn, for fibroids  . VARICOSE VEIN SURGERY  1978    Social History:  reports that she has never smoked. She has never used smokeless tobacco. She reports that she does not drink alcohol or use drugs.  No Known Allergies  Family History  Problem Relation Age of Onset  . Hypertension Father   . Stroke Father     in 5270s    . Breast cancer Daughter   . Diabetes Neg Hx   . Heart disease Neg Hx     Prior to Admission medications   Medication Sig Start Date End Date Taking? Authorizing Provider  allopurinol (ZYLOPRIM) 300 MG tablet Take 1 tablet (300 mg total) by mouth daily. Patient taking differently: Take 300 mg by mouth at bedtime.  03/05/16  Yes Kimber RelicArthur G Green, MD  furosemide (LASIX) 80 MG tablet Take 80 mg by mouth daily with breakfast.    Yes Historical Provider, MD  levofloxacin (LEVAQUIN) 500 MG tablet Take 500 mg by mouth daily. Started 12/28 for 7 days for PNA   Yes Historical Provider, MD  levothyroxine (SYNTHROID, LEVOTHROID) 75 MCG tablet Take 75 mcg by mouth daily before breakfast.   Yes Historical Provider, MD  neomycin-bacitracin-polymyxin (NEOSPORIN) ointment Apply 1 application topically as needed for wound care.    Yes Historical Provider, MD  promethazine (PHENERGAN) 12.5 MG tablet Take 12.5 mg by mouth every 6 (six) hours as needed for nausea.    Yes Historical Provider, MD  saccharomyces boulardii (FLORASTOR) 250 MG capsule Take 250 mg by mouth 2 (two) times daily. Started 12/21 for 14 days   Yes Historical Provider, MD  spironolactone (ALDACTONE) 25 MG tablet Take 25 mg by mouth daily with breakfast.   Yes Historical Provider, MD  sulfamethoxazole-trimethoprim (BACTRIM DS,SEPTRA DS) 800-160 MG tablet One twice daily to treat infection Patient taking differently: Take 1 tablet by mouth 2 (two) times daily. Started 12/21 for 14 days 05/14/16  Yes [name], MD  traMADol (ULTRAM) 50 MG tablet Take 1/2 (25mg ) tablet by mouth twice daily; Take two (1/2 tablets)=50mg   by mouth once daily as needed for pain Patient taking differently: Take 25 mg by mouth 2 (two) times daily.  04/08/16  Yes [name], MD  traMADol (ULTRAM) 50 MG tablet Take 50 mg by mouth daily as needed for moderate pain.   Yes Historical Provider, MD    Physical Exam: Vitals:   05/28/2016 1500 06/16/2016 1530  BP: (!)  130/114 129/94  Pulse: 89 (!) 40  Resp: (!) 33 24  Temp:       General: Well developed, frail, elderly lady, NAD, appears stated age  HEENT: NCAT, PERRLA, EOMI, Anicteic Sclera, mucous membranes dry  Neck: Supple, no JVD, no masses  Cardiovascular: S1 S2 auscultated, no rubs, murmurs or gallops. bradycardic  Respiratory: Poor air movement, agonal breathing.   Abdomen: Soft, nontender, nondistended, + bowel sounds  Extremities: warm dry without cyanosis clubbing or edema. Cool to touch. Multiple LE wounds.  Labs on Admission: I have personally reviewed following labs and imaging studies CBC: No results for input(s): WBC, NEUTROABS, HGB, HCT, MCV, PLT in the last 168 hours. Basic Metabolic Panel: No results for input(s): NA, K, CL, CO2, GLUCOSE, BUN, CREATININE, CALCIUM, MG, PHOS in the last 168 hours. GFR: CrCl cannot be calculated (Patient's most recent lab result is older than the maximum 21 days allowed.). Liver Function Tests: No results for input(s): AST, ALT, ALKPHOS, BILITOT, PROT, ALBUMIN in the last 168 hours. No results for input(s): LIPASE, AMYLASE in the last 168 hours. No results for input(s): AMMONIA in the last 168 hours. Coagulation Profile: No results for input(s): INR, PROTIME in the last 168 hours. Cardiac Enzymes: No results for input(s): CKTOTAL, CKMB, CKMBINDEX, TROPONINI in the last 168 hours. BNP (last 3 results) No results for input(s): PROBNP in the last 8760 hours. HbA1C: No results for input(s): HGBA1C in the last 72 hours. CBG: No results for input(s): GLUCAP in the last 168 hours. Lipid Profile: No results for input(s): CHOL, HDL, LDLCALC, TRIG, CHOLHDL, LDLDIRECT in the last 72 hours. Thyroid Function Tests: No results for input(s): TSH, T4TOTAL, FREET4, T3FREE, THYROIDAB in the last 72 hours. Anemia Panel: No results for input(s): VITAMINB12, FOLATE, FERRITIN, TIBC, IRON, RETICCTPCT in the last 72 hours. Urine analysis:    Component  Value Date/Time   BILIRUBINUR Neg 01/01/2012 1511   PROTEINUR Neg 01/01/2012 1511   UROBILINOGEN 0.2 01/01/2012 1511   NITRITE Neg 01/01/2012 1511   LEUKOCYTESUR Negative 01/01/2012 1511   Sepsis Labs: @LABRCNTIP (procalcitonin:4,lacticidven:4) )No results found for this or any previous visit (from the past 240 hour(s)).   Radiological Exams on Admission: No results found.  EKG: None  Assessment/Plan  Goals of care -Spoke with patient's family, at bedside, who opted to make patient comfort care. Patient will be placed on morphine as needed as well as Ativan, Haldol. -If patient makes it through the night, will discuss residential hospice -Social worker consulted  Sepsis -Brought to the ED for fever and shortness of breath. Found to febrile, tachycardic, tachypneic, hypotensive. -EDP spoke with the patient's family who opted to make her comfort care -Patient was taking Levaquin for recent diagnosis as well as Bactrim for MRSA lower extremity wounds.  Acute hypoxic respiratory failure -Recent diagnosis of pneumonia -SPO2 90 B in the 70s upon arrival to the ED  Hypothyroidism  DVT prophylaxis: None,comfort care  Code Status: DNR  Family Communication:  Family at bedside. Admission, patients condition and plan of care including tests being ordered have been discussed with the patient's family, who indicate understanding and agree with the plan and Code Status.  Disposition Plan: TBD, possible hospital death  Consults called: None   Admission status: Observation   Time spent: 50 minutes  Leemon Ayala D.O. [facility] Pager [phone]  If 7PM-7AM, please contact night-coverage [email or web address] Password [email or web address] 05/25/2016, 4:16 PM

## 2016-05-29 ENCOUNTER — Ambulatory Visit: Payer: Medicare Other | Admitting: Family

## 2016-05-29 ENCOUNTER — Encounter (HOSPITAL_COMMUNITY): Payer: Medicare Other

## 2016-06-25 NOTE — Progress Notes (Signed)
Patient was observed to be breathing shallow and slowly then found have no respirations, no pulse, and pallor upon morning assessment at 0545. Patient declared expired by two RNs.  Craige CottaKirby, MD notified. Patient's daughter Johnny BridgeMartha notified.  [facility] notified and declared that she was not an eligible candidate for donating. Bradly Bienenstock-Danielle Monroe. Referral # [id number]. Patient placement notified. Awaiting family member to arrive to hospital.

## 2016-06-25 NOTE — Discharge Summary (Signed)
Physician Death Summary  Heather Lambvelyn S Vasbinder [id number] DOB: 1916-07-26 DOA: 05/31/2016  PCP: Murray HodgkinsArthur Green, MD  Admit date: 06/19/2016 Discharge date: 06/23/2016  HPI: Heather Morrison is a 83 y.o. female with a medical history of recent pneumonia, MRSA lower extremity wounds, hypertension, hypothyroidism, presented to emergency department from [facility] with altered mental status, lethargy, agonal breathing and involuntary tremors. Per patient's son, she was recently diagnosed with pneumonia and placed on Levaquin. He states he saw our Sunday and noted that her health was deteriorating. Further information is limited due to patient's current condition.  ED/hospital course: Found to have hypotension, tachycardia, tachypnea, fever. EDP spoke with patient's family, patient's family opted for comfort care. Patient given morphine in the emergency department. [facility] called for admission. The patient died, per report, at 05:45 on May 27, 2016.  Discharge Diagnoses:  Active Problems:   Hypothyroidism   Admission for palliative care   End of life care   Sepsis (HCC)   Respiratory failure, acute (HCC)  Subjective: I did not examine the patient.  Discharge Exam: Vitals:   06/10/2016 2200 06/11/2016 2240  BP: (!) 67/51 (!) 147/111  Pulse: 98   Resp: (!) 36   Temp: 98.8 F (37.1 C)    I did not examine the patient.   Hazeline Junkeryan Amberlynn Tempesta, MD  [facility] 05/28/2016, 4:19 PM Pager [phone]

## 2016-06-25 NOTE — Progress Notes (Signed)
Pt expired at 0545 pronounced by 2 RNs. Death was expected as pt was admitted with sepsis and placed on comfort care. Death certificate completed and given to [place], Charity fundraiserN.  KJKG, NP [facility]

## 2016-06-25 NOTE — Progress Notes (Signed)
Daughter has arrived to visit patient and stated no one else will be visiting the patient. [facility]

## 2016-06-25 DEATH — deceased

## 2017-04-19 IMAGING — CR DG FOOT COMPLETE 3+V*R*
3 series · 3 of 3 positions shown · non-contrast
Comparison: Right ankle radiographs from 11/18/2012

CLINICAL DATA: Plantar calcaneal enthesophyte.

EXAM:
RIGHT FOOT COMPLETE - 3+ VIEW

[t foot ap right]
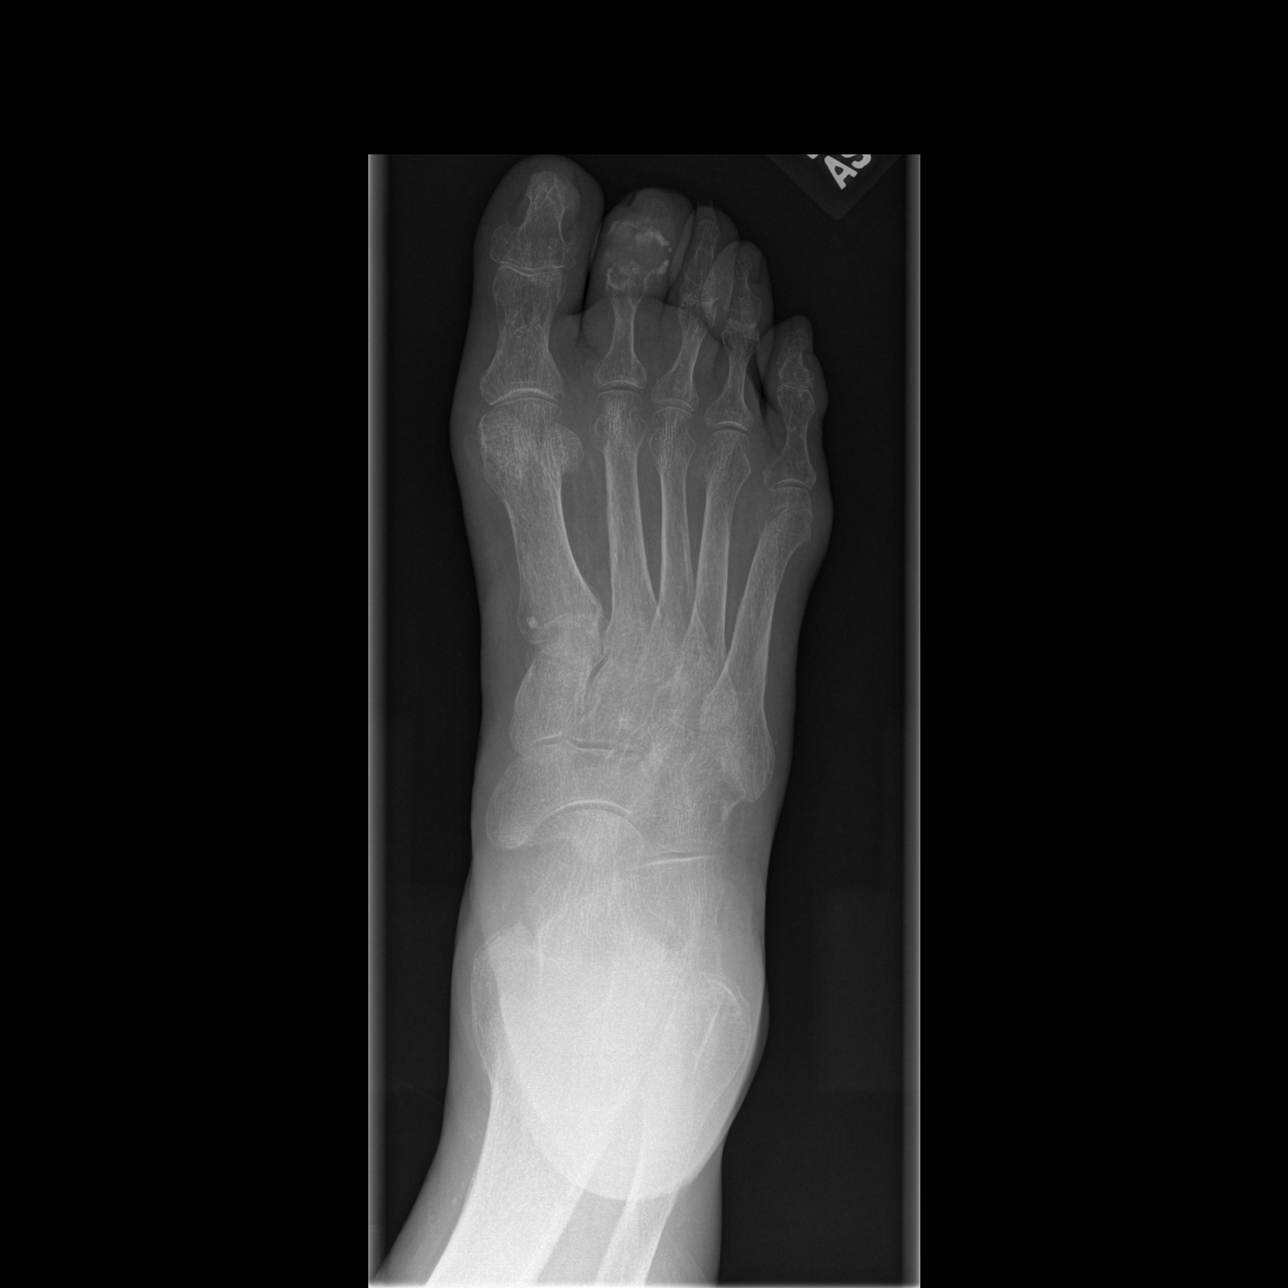

[t foot oblique right]
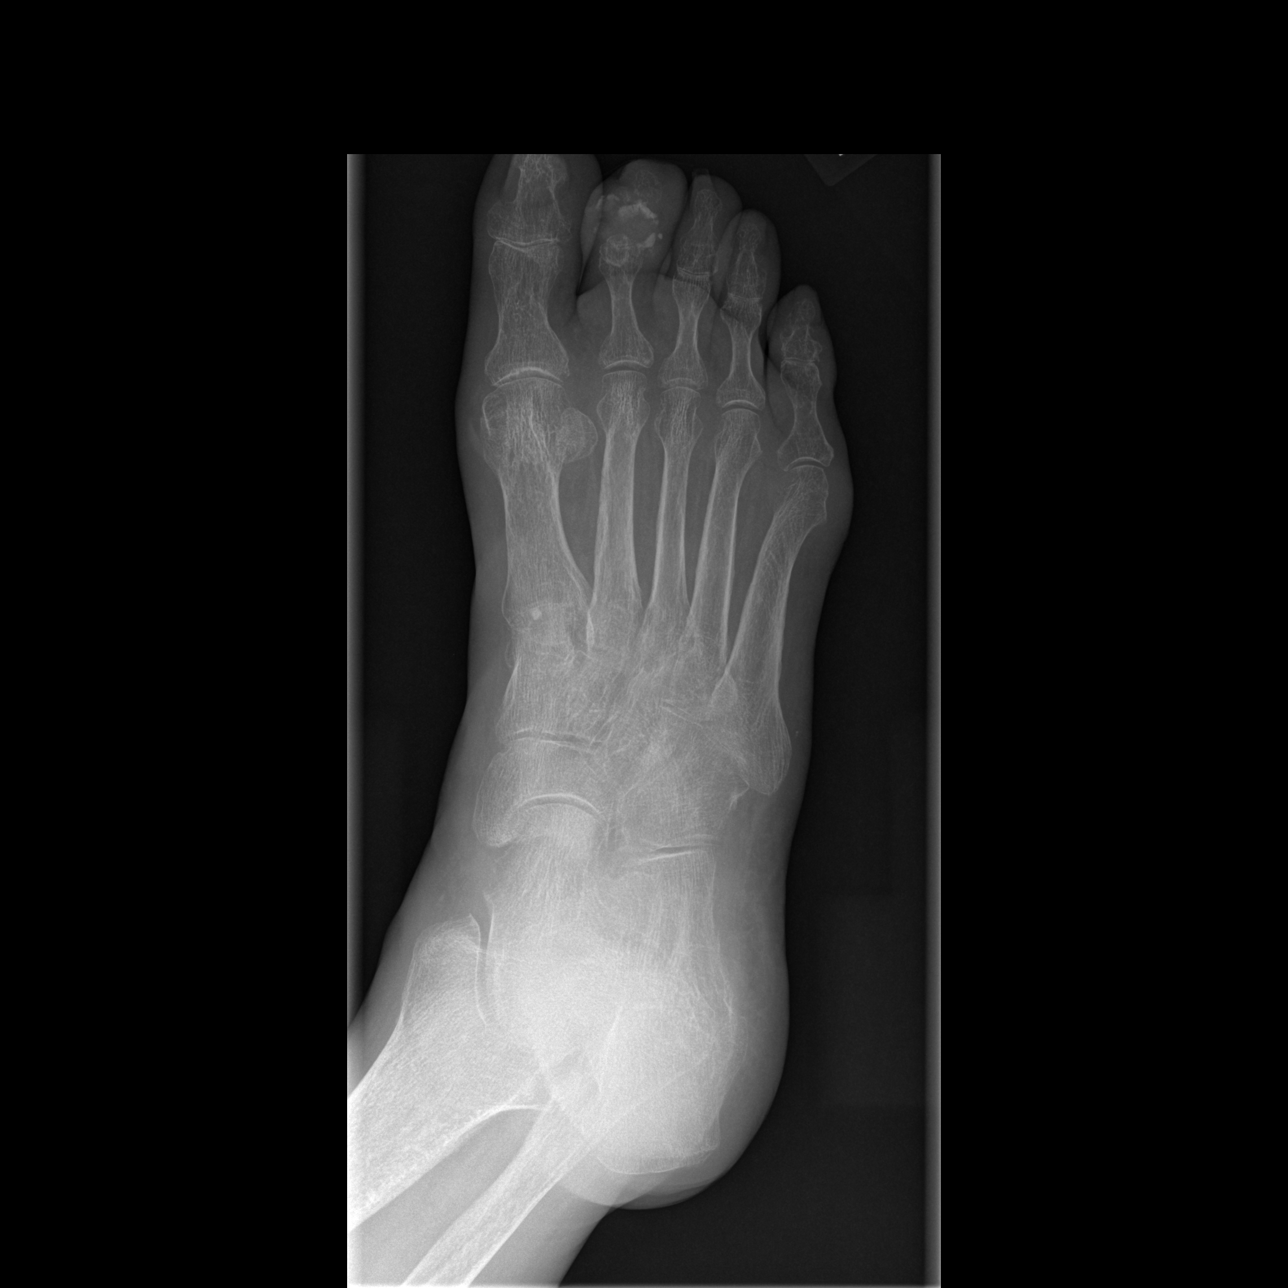

[t foot lat right]
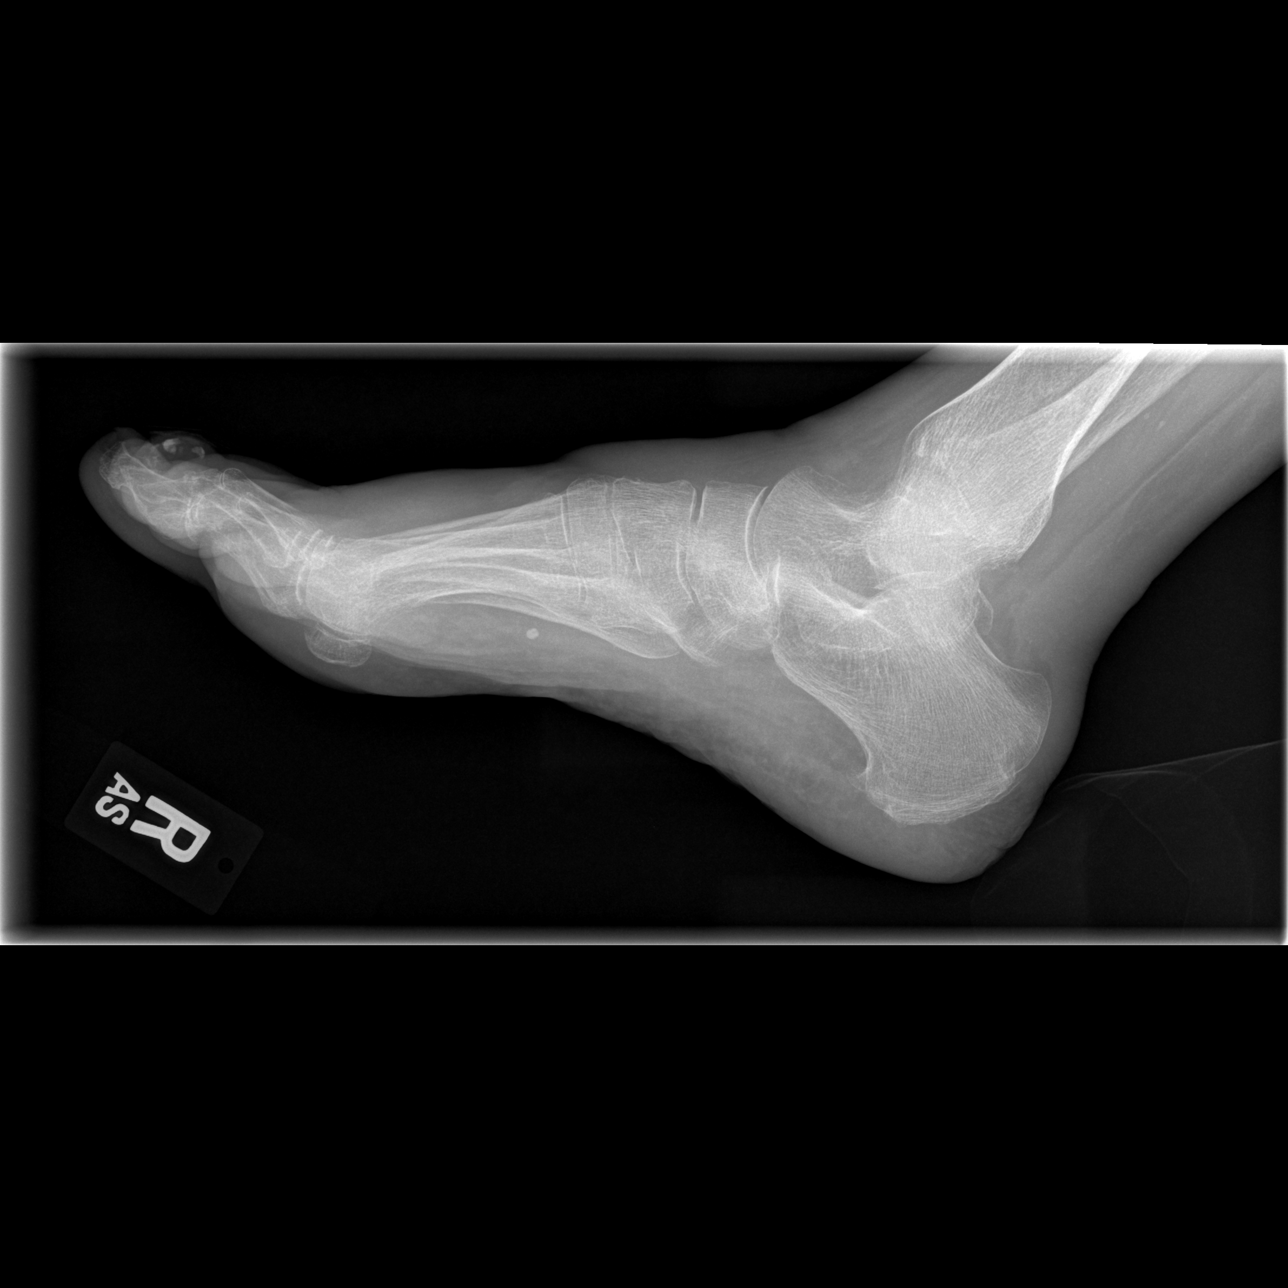

[3 of 3 positions shown; findings below may reference images not displayed]

FINDINGS: The osseous elements of the right foot are demineralized and
osteopenic in appearance. There is soft tissue swelling over the
dorsum of the foot. There is also soft tissue swelling of the second
toe with what appear to be bony sequestra involving the middle
phalanx. Soft tissue calcifications are noted over the dorsum of the
right second toe. Findings are concerning for osteomyelitis. Joint
space narrowing of the third through fifth DIP and PIP joints,
interphalangeal joint of the great toe and first MTP articulations
are seen. Tiny subcortical erosions along the lateral base of the
first MTP joint. Small phlebolith is suggested accounting for a
calcified soft tissue density along the plantar aspect of the
midfoot. Cornuate shaped navicular bone consistent with a type 3
navicular.
IMPRESSION: Soft tissue swelling of the right second toe with bone destruction
of the middle phalanx in a pattern concerning for osteomyelitis.
There appear to be adjacent bony sequestra and calcifications at the
level of the middle phalanx.

Stable plantar calcaneal enthesophyte.
